# Patient Record
Sex: Female | Born: 1990 | Hispanic: Yes | Marital: Married | Smoking: Never smoker
Health system: Southern US, Community
[De-identification: ages and names within clinical notes are randomized; demographics above are authoritative.]

## PROBLEM LIST (undated history)

## (undated) ENCOUNTER — Inpatient Hospital Stay (HOSPITAL_COMMUNITY): Payer: Self-pay

## (undated) DIAGNOSIS — Z803 Family history of malignant neoplasm of breast: Secondary | ICD-10-CM

## (undated) DIAGNOSIS — Z789 Other specified health status: Secondary | ICD-10-CM

## (undated) DIAGNOSIS — Z8042 Family history of malignant neoplasm of prostate: Secondary | ICD-10-CM

## (undated) DIAGNOSIS — D649 Anemia, unspecified: Secondary | ICD-10-CM

## (undated) HISTORY — PX: NO PAST SURGERIES: SHX2092

## (undated) HISTORY — DX: Family history of malignant neoplasm of prostate: Z80.42

## (undated) HISTORY — DX: Family history of malignant neoplasm of breast: Z80.3

## (undated) HISTORY — PX: PLACEMENT OF BREAST IMPLANTS: SHX6334

---

## 2012-08-25 NOTE — L&D Delivery Note (Signed)
Delivery Note At 4:12 AM a viable female was delivered via Vaginal, Spontaneous Delivery (Presentation: Left Occiput Anterior).  APGAR: 8, 9; weight pending. Infant had spontaneous cry. Cord clamped x2 and cut and he was taken to warmer to be assessed by NICU team. Active management of 3rd stage with cord traction and pitocin. Placenta status: Intact, Spontaneous.  Cord: 3 vessels with the following complications: None. Cord pH: not obtained. No tears. Hemostasis achieved prior to leaving the room.  Anesthesia: Epidural  Episiotomy: None Lacerations: None Suture Repair: n/a Est. Blood Loss (mL): 350  Mom to postpartum.  Baby to NICU.  Sophia Decker, Sophia Decker 08/16/2013, 4:30 AM  I was present for and supervised the delivery of this newborn.  I agree with above documentation.  Loose body cord that was delivered through.   [name], [name], MD

## 2013-03-10 ENCOUNTER — Emergency Department (HOSPITAL_COMMUNITY)
Admission: EM | Admit: 2013-03-10 | Discharge: 2013-03-10 | Disposition: A | Discharge status: Home / Self Care | Payer: MEDICAID | Attending: Emergency Medicine | Admitting: Emergency Medicine

## 2013-03-10 ENCOUNTER — Encounter (HOSPITAL_COMMUNITY): Payer: Self-pay | Admitting: Adult Health

## 2013-03-10 DIAGNOSIS — R109 Unspecified abdominal pain: Secondary | ICD-10-CM | POA: Insufficient documentation

## 2013-03-10 DIAGNOSIS — O209 Hemorrhage in early pregnancy, unspecified: Secondary | ICD-10-CM | POA: Insufficient documentation

## 2013-03-10 DIAGNOSIS — N72 Inflammatory disease of cervix uteri: Secondary | ICD-10-CM | POA: Insufficient documentation

## 2013-03-10 DIAGNOSIS — O9989 Other specified diseases and conditions complicating pregnancy, childbirth and the puerperium: Secondary | ICD-10-CM | POA: Insufficient documentation

## 2013-03-10 DIAGNOSIS — O2390 Unspecified genitourinary tract infection in pregnancy, unspecified trimester: Secondary | ICD-10-CM | POA: Insufficient documentation

## 2013-03-10 DIAGNOSIS — N939 Abnormal uterine and vaginal bleeding, unspecified: Secondary | ICD-10-CM | POA: Diagnosis present

## 2013-03-10 LAB — CBC
Hemoglobin: 14.1 g/dL (ref 12.0–15.0)
MCH: 30.4 pg (ref 26.0–34.0)
Platelets: 214 10*3/uL (ref 150–400)
RBC: 4.64 MIL/uL (ref 3.87–5.11)
WBC: 7.2 10*3/uL (ref 4.0–10.5)

## 2013-03-10 LAB — POCT PREGNANCY, URINE: Preg Test, Ur: POSITIVE — AB

## 2013-03-10 LAB — WET PREP, GENITAL: Trich, Wet Prep: NONE SEEN

## 2013-03-10 LAB — HCG, QUANTITATIVE, PREGNANCY: hCG, Beta Chain, Quant, S: 100048 m[IU]/mL — ABNORMAL HIGH (ref <5)

## 2013-03-10 MED ORDER — METRONIDAZOLE 500 MG PO TABS: 500.0000 mg | ORAL_TABLET | Freq: Two times a day (BID) | ORAL | Status: AC

## 2013-03-10 MED ORDER — AZITHROMYCIN 250 MG PO TABS
1000.0000 mg | ORAL_TABLET | Freq: Once | ORAL | Status: AC
  Administered 2013-03-10: 1000 mg via ORAL
  Filled 2013-03-10: qty 2

## 2013-03-10 MED ORDER — AZITHROMYCIN 250 MG PO TABS
500.0000 mg | ORAL_TABLET | Freq: Once | ORAL | Status: DC
  Filled 2013-03-10: qty 2

## 2013-03-10 MED ORDER — CEFTRIAXONE SODIUM 250 MG IJ SOLR
250.0000 mg | Freq: Once | INTRAMUSCULAR | Status: AC
  Administered 2013-03-10: 250 mg via INTRAMUSCULAR
  Filled 2013-03-10: qty 250

## 2013-03-10 NOTE — ED Notes (Addendum)
Resents with vaginal bleed ing that began last night and lower abdominal cramping. Describes blood as dark and spotting, not heavy. LMP 01/12/13, pt states she is [redacted] weeks pregnant and has not had prenatal care yet.  Gravida 2, Para 0

## 2013-03-10 NOTE — ED Notes (Signed)
Patient stated that she noticed some vaginal bleeding last night and again today.  States she is approx [redacted] weeks pregnant, has not had any prenatal care at this time.

## 2013-03-10 NOTE — ED Provider Notes (Signed)
History    CSN: [id number] Arrival date & time 03/10/13  1448  None    Chief Complaint  Patient presents with  . Vaginal Bleeding   (Consider location/radiation/quality/duration/timing/severity/associated sxs/prior Treatment) HPI Comments: Sophia Decker 22 y.o. Presents for vaginal bleeding. [redacted]w[redacted]d pregnant per [facility] at recent visit. Vaginal bleeding is minimal and has not required use of pads but is worsening. No passage of tissue. Minimal abdominal cramping, like "period cramps". See below for further details.   Patient is a 22 y.o. female presenting with female genitourinary complaint. The history is provided by the patient.  Female GU Problem This is a new (vaginal bleeding) problem. The current episode started today (this morning). The problem occurs intermittently. The problem has been gradually worsening. Associated symptoms include abdominal pain (onset today, cramping in character, 3/10, no known exacerbating or relieving factors, minimal in severity). Pertinent negatives include no anorexia, arthralgias, change in bowel habit, chest pain, chills, congestion, coughing, diaphoresis, fatigue, fever, headaches, joint swelling, myalgias, nausea, neck pain, numbness, rash, sore throat, swollen glands, urinary symptoms, vertigo, visual change, vomiting or weakness. Nothing aggravates the symptoms. She has tried nothing for the symptoms.   History reviewed. No pertinent past medical history. History reviewed. No pertinent past surgical history. History reviewed. No pertinent family history. History  Substance Use Topics  . Smoking status: Never Smoker   . Smokeless tobacco: Not on file  . Alcohol Use: No   OB History   Grav Para Term Preterm Abortions TAB SAB Ect Mult Living   2 0 0 0 1          Review of Systems  Constitutional: Negative for fever, chills, diaphoresis and fatigue.  HENT: Negative for congestion, sore throat and neck pain.   Respiratory: Negative for  cough.   Cardiovascular: Negative for chest pain.  Gastrointestinal: Positive for abdominal pain (onset today, cramping in character, 3/10, no known exacerbating or relieving factors, minimal in severity). Negative for nausea, vomiting, anorexia and change in bowel habit.  Genitourinary: Positive for vaginal bleeding. Negative for dysuria, frequency, vaginal discharge, difficulty urinating, vaginal pain and dyspareunia.  Musculoskeletal: Negative for myalgias, joint swelling and arthralgias.  Skin: Negative for rash.  Neurological: Negative for vertigo, weakness, numbness and headaches.    Allergies  Review of patient's allergies indicates no known allergies.  Home Medications   Current Outpatient Rx  Name  Route  Sig  Dispense  Refill  . Prenatal Vit-Fe Fumarate-FA (PRENATAL MULTIVITAMIN) TABS   Oral   Take 1 tablet by mouth daily at 12 noon. Gummy pre-natal vitamin          BP 131/76  Pulse 78  Temp(Src) 98.7 F (37.1 C) (Oral)  Resp 16  Ht 6' (1.829 m)  Wt 160 lb (72.576 kg)  BMI 21.7 kg/m2  SpO2 100% Physical Exam  Constitutional: She is oriented to person, place, and time. She appears well-developed and well-nourished. No distress.  HENT:  Head: Normocephalic and atraumatic.  Right Ear: External ear normal.  Left Ear: External ear normal.  Eyes: Conjunctivae and EOM are normal. Right eye exhibits no discharge. Left eye exhibits no discharge.  Neck: Normal range of motion. Neck supple. No JVD present.  Cardiovascular: Normal rate, regular rhythm and normal heart sounds.  Exam reveals no gallop and no friction rub.   No murmur heard. Pulmonary/Chest: Effort normal and breath sounds normal. No stridor. No respiratory distress. She has no wheezes. She has no rales. She exhibits no tenderness.  Abdominal:  Soft. Bowel sounds are normal. She exhibits no distension. There is no tenderness. There is no rebound and no guarding.  Genitourinary: Cervix exhibits motion tenderness,  discharge (Muco-purulent) and friability (Strawberry appearance). Right adnexum displays no mass, no tenderness and no fullness. Left adnexum displays no mass, no tenderness and no fullness.  Musculoskeletal: Normal range of motion. She exhibits no edema.  Neurological: She is alert and oriented to person, place, and time.  Skin: Skin is warm. No rash noted. She is not diaphoretic.  Psychiatric: She has a normal mood and affect. Her behavior is normal.    ED Course  Procedures (including critical care time) Labs Reviewed  HCG, QUANTITATIVE, PREGNANCY - Abnormal; Notable for the following:    hCG, Beta Chain, Quant, S 100048 (*)    All other components within normal limits  POCT PREGNANCY, URINE - Abnormal; Notable for the following:    Preg Test, Ur POSITIVE (*)    All other components within normal limits  CBC   No results found. No diagnosis found. Results for orders placed during the hospital encounter of 03/10/13  HCG, QUANTITATIVE, PREGNANCY      Result Value Range   hCG, Beta Chain, Quant, S 100048 (*) <5 mIU/mL  CBC      Result Value Range   WBC 7.2  4.0 - 10.5 K/uL   RBC 4.64  3.87 - 5.11 MIL/uL   Hemoglobin 14.1  12.0 - 15.0 g/dL   HCT 16.1  09.6 - 04.5 %   MCV 86.9  78.0 - 100.0 fL   MCH 30.4  26.0 - 34.0 pg   MCHC 35.0  30.0 - 36.0 g/dL   RDW 40.9  81.1 - 91.4 %   Platelets 214  150 - 400 K/uL  POCT PREGNANCY, URINE      Result Value Range   Preg Test, Ur POSITIVE (*) NEGATIVE    Results for orders placed during the hospital encounter of 03/10/13  WET PREP, GENITAL      Result Value Range   Yeast Wet Prep HPF POC RARE YEAST (*) NONE SEEN   Trich, Wet Prep NONE SEEN  NONE SEEN   Clue Cells Wet Prep HPF POC NONE SEEN  NONE SEEN   WBC, Wet Prep HPF POC MANY (*) NONE SEEN  HCG, QUANTITATIVE, PREGNANCY      Result Value Range   hCG, Beta Chain, Quant, S 100048 (*) <5 mIU/mL  CBC      Result Value Range   WBC 7.2  4.0 - 10.5 K/uL   RBC 4.64  3.87 - 5.11 MIL/uL    Hemoglobin 14.1  12.0 - 15.0 g/dL   HCT 78.2  95.6 - 21.3 %   MCV 86.9  78.0 - 100.0 fL   MCH 30.4  26.0 - 34.0 pg   MCHC 35.0  30.0 - 36.0 g/dL   RDW 08.6  57.8 - 46.9 %   Platelets 214  150 - 400 K/uL  POCT PREGNANCY, URINE      Result Value Range   Preg Test, Ur POSITIVE (*) NEGATIVE     BED SIDE [place] - limited transabdominal  Indication: pregnancy with vaginal bleeding Findings: intrauterine fetal pole present with fetal HR of >130 MDM  Sophia Decker 22 y.o. presents with vaginal bleeding. No symptoms of acute blood loss anemia. AFVSS. HDS. Hcg > 100k. Hgb stable. Bed side US shows IUP with FHR. GU exam - mucosa. A discharge from the cervix, cervix is friable and Strawberry  in appearance. Cervix did appear closed.  Wet prep negative for trichomonas and BV. However I have a high clinical suspicion trichomonas is present. In addition, with mucopurulent discharge, cannot rule out other STDs. Patient was given 250 mg of intramuscular Rocephin and 1 g of azithromycin in the emergency department. She was given a prescription for Flagyl and instructed to take this for the next 7 days. She also instructed to establish care with an OB/GYN clinic and obtain followup. She was educated on the diagnosis of threatened abortion and possible STIs. She was given strong return precautions.   Labs reviewed. I discussed this patient with my attending, Dr. Freida Busman.   Sena Hitch, MD 03/11/13 [phone]

## 2013-03-10 NOTE — ED Notes (Signed)
1910  Received report from off going RN and introduced myself to the pt.  Status of pt is good at this time but will continue to monitor

## 2013-03-11 LAB — GC/CHLAMYDIA PROBE AMP
CT Probe RNA: NEGATIVE
GC Probe RNA: NEGATIVE

## 2013-03-13 NOTE — ED Provider Notes (Signed)
I saw and evaluated the patient, reviewed the resident's note and I agree with the findings and plan.  Phil Michels T Otho Michalik, MD 03/13/13 0705 

## 2013-03-24 ENCOUNTER — Encounter: Payer: Self-pay | Admitting: Obstetrics & Gynecology

## 2013-03-24 ENCOUNTER — Ambulatory Visit (INDEPENDENT_AMBULATORY_CARE_PROVIDER_SITE_OTHER): Payer: Self-pay | Admitting: Obstetrics & Gynecology

## 2013-03-24 VITALS — BP 121/67 | Temp 98.0°F | Ht 68.0 in | Wt 160.5 lb

## 2013-03-24 DIAGNOSIS — Z3490 Encounter for supervision of normal pregnancy, unspecified, unspecified trimester: Secondary | ICD-10-CM | POA: Insufficient documentation

## 2013-03-24 DIAGNOSIS — O209 Hemorrhage in early pregnancy, unspecified: Secondary | ICD-10-CM

## 2013-03-24 DIAGNOSIS — Z3480 Encounter for supervision of other normal pregnancy, unspecified trimester: Secondary | ICD-10-CM

## 2013-03-24 DIAGNOSIS — Z3492 Encounter for supervision of normal pregnancy, unspecified, second trimester: Secondary | ICD-10-CM

## 2013-03-24 DIAGNOSIS — Z3491 Encounter for supervision of normal pregnancy, unspecified, first trimester: Secondary | ICD-10-CM

## 2013-03-24 LAB — POCT URINALYSIS DIP (DEVICE)
Hgb urine dipstick: NEGATIVE
Nitrite: NEGATIVE
Specific Gravity, Urine: 1.02 (ref 1.005–1.030)
Urobilinogen, UA: 0.2 mg/dL (ref 0.0–1.0)
pH: 7 (ref 5.0–8.0)

## 2013-03-24 NOTE — Progress Notes (Signed)
Ultrasound scheduled for 03/30/13 at 10:15 am.

## 2013-03-24 NOTE — Patient Instructions (Addendum)
Cuidados prenatales (Prenatal Care) QU SON LOS CUIDADOS PRENATALES?  Cuidados prenatales significan la asistencia de la salud antes de que nazca el beb. Cudese usted y tambin cuide al beb del siguiente modo:   Siga los cuidados prenatales desde comienzos del Psychiatrist. Si sabe que est embarazada o piensa que podra estarlo, comunquese con su mdico lo antes posible. Arregle una visita para un examen general o prenatal.  Siga los cuidados prenatales de [place] regular. Siga el esquema para realizar anlisis de Enterprise y otras pruebas necesarias y Joelyn Oms a todas las citas.  Si hace todo esto podr [employer]co Wholesale y la del beb durante todo el Psychiatrist.  Los cuidados prenatales deben incluir una evaluacin de las necesidades mdicas, [place], [place], psicolgicas y [place] para la pareja y la historia [place], [place] y gentica de la familia de la madre y el padre.  Comente con su mdico:  Los medicamentos prescriptos, de venta libre y las hierbas medicinales que toma.  Si consume drogas, alcohol o fuma.  Si sufre violencia o abuso familiar.  Las vacunas que ha recibido.  Su nutricin y Surveyor, mining.  La actividad fsica Development worker, international aid.  Peligros ambientales y Forensic psychologist, del hogar y [place].  Historia de enfermedades de transmisin sexual que hayan sufrido usted y Risk analyst.  Embarazos previos. PORQU LOS CUIDADOS PRENATALES SON TAN IMPORTANTES?  Si la controla con regularidad, el profesional tendr la oportunidad de Clinical research associate problemas precozmente, de modo que puede tratarlos lo antes posible. Tambin puede llegar a Photographer. Muchos estudios han demostrado que una atencin prenatal temprana y regular es importante tanto para la salud de las madres como de sus bebs.  Concha Se EMBARAZADA COMO PUEDO CUIDARME?  El cuidado de la mujer antes de quedar embarazada la ayuda a Warehouse manager un Psychiatrist saludable y a Technical sales engineer las posibilidades de tener un beb con  problemas. Estas son las formas de cuidarse antes de quedan embarazada:   Consuma alimentos saludables, realice actividad fsica regularmente (lo ms aconsejable es 30 minutos por da, [employer]) y descanse y duerma lo suficiente. Converse con el profesional acerca de cules son los alimentos y ejercicios que ms le convienen.  Tome 400 microgramos (mcg) de cido flico (una de las vitaminas del grupo B) todos [place]. Lo ms aconsejable es tomar un multivitamnico que contenga esta cantidad de cido flico. Si toma la cantidad suficiente de cido flico sinttico (manufacturado) todos los [place], antes de [place] y a comienzos del Psychiatrist, podr evitar que el beb sufra ciertos defectos. Hay cereales para el desayuno y otros productos que contienen granos a los que se les ha agregado cido flico, pero no todos contienen 400 mcg por porcin. Controle las etiquetas del multivitamnico o del cereal para conocer la cantidad de cido flico que contienen.  Verifique que cuenta con todas las vacunaciones, especialmente la de la [place]. La rubola puede causar graves defectos al beb. La varicela es otra enfermedad que debe evitarse durante el [place]. Si ha sufrido varicela o rubola en el pasado, usted est inmunizada.  Informe al mdico acerca de todos los medicamentos prescriptos, de venta libre o hierbas que toma. Algunos medicamentos no son seguros para tomarlos Academic librarian.  Deje de fumar, de beber alcohol o de tomar drogas. Pdale ayuda al mdico, si es necesario.  Comente y trate cualquier problema mdico, social o psicolgico antes de quedar [place].  Comente cualquier historia de problemas genticos en su familia o en la del padre. Siempre  que sea posible, haga pruebas genticas antes de quedar embarazada.  Comente a su mdico si es vctima de maltratos fsicos o emocionales.  Comente con el profesional si est expuesta a sustancias qumicas perjudiciales en su  lugar de trabajo o en el lugar en el que vive.  Comente con el profesional si considera que su trabajo o las horas que trabaja pueden ser perjudiciales y debera modificarlos.  El padre debe estar incluido en todas las tomas de decisiones con todos los aspectos del [place], el [place] de parto y [place].  Si tiene KB Home	[place], asegrese que esta cubierta por Psychiatrist. RECIN ME ENTERO QUE ESTOY EMBARAZADA COMO PUEDO CUIDARME?  Aqu hay algunas indicaciones para cuidarse usted misma y la preciosa nueva vida que crece en su interior.   Contine tomando el multivitamnico con 400 microgramos (mcg) de cido [employer]ab.  Siga los cuidados prenatales desde comienzos del embarazo. No importa si este es Contractor o si ya tiene otros nios. Es importante que consulte con un profesional durante el Psychiatrist. El Facilities manager en cada visita para verificar que usted y el beb estn sanos. Si hubiera problemas, tomar medidas para ayudarla a usted o a su beb.  Consuma una dieta saludable que incluya.  Frutas  Vegetales  Alimentos que contengan pocas grasas saturadas.  Granos.  Alimentos ricos en calcio.  Beba entre 6 y 8 vasos de lquidos por Futures trader.  Excepto que el profesional le indique lo contrario, trate de mantenerse fsicamente activa durante 30 minutos, casi todos los [address] de la [place]. Si no tiene tiempo, realice [place] en segmentos de 10 minutos, tres Teacher, early years/pre.  Si fuma, bebe alcohol o consume drogas DEJE DE HACERLO. Estas sustancias pueden causar daos crnicos al beb. Converse con el profesional que la asiste con respecto a los pasos a seguir para International aid/development worker. Converse con algn miembro de la comunidad religiosa, terapeuta, un amigo de confianza o su mdico, si est preocupada con respecto a su consumo de alcohol o drogas.  Consulte con el profesional antes de tomar Pacific Mutual, an los de [employer]. Algunos medicamentos no son  seguros para tomarlos Academic librarian.  Descanse y duerma lo suficiente.  Evite jacuzzis y saunas durante el embarazo  No pueden tomarle radiografas, excepto que sea absolutamente necesario y con autorizacin del mdico. Le colocarn una pantalla de plomo sobre el abdomen para proteger al beb cuando los rayos x ingresen a su organismo  No limpie el lecho del gato si est embarazada. Puede contener un parsito que causa una infeccin denominada toxoplasmosis, que ocasiona defectos en el beb. Tambin utilice guantes cuando trabaje en las zonas del jardn en las que se encuentra el [place].  No coma carne, pescado o queso crudo o mal cocido.  Permanezca alejada de sustancias txicas como:  Insecticidas.  Solventes (como algunos limpiadores o disolvente de pinturas).  Plomo.  Mercurio.  Puede tener relaciones sexuales hasta el final del embarazo excepto que sufra algn problema mdico o tenga alguna dificultad en el embarazo y su mdico le aconseje que no las tenga.  No use tacones altos, especialmente durante la segunda mitad del [place]. Puede perder el equilibrio y caerse.  No haga viajes largos excepto que sea absolutamente necesario. Asegrese de [employer] visita al mdico antes de hacer el viaje.  No permanezca sentada en una posicin durante ms de 2 horas cuando viaje.  Lleve una copia de los registros mdicos cuando  vaya de viaje.  Averige dnde [facility] hospital en la ciudad que visitar, en caso de emergencia.  Los productos de limpieza ms peligrosos tienen advertencias para la mujer embarazada en la etiqueta. Consulte con el profesional con respecto a los productos si no est segura.  Limite o elimine su consumo de cafena proveniente del caf, t, gaseosas, medicamentos y chocolate.  Muchas mujeres siguen trabajando Academic librarian. Permanecer activa la ayuda a mantenerse sana. Si tiene preguntas relacionadas con la seguridad o las horas que trabaja,  consltelo con el profesional que la asiste.  Mantngase informada:  Lea libros.  Mire vdeos.  Concurra con el padre a las clases de preparto.  Converse con otras mams experimentadas.  Consulte con el profesional con respecto a las clases de preparto para usted y Camera operator. Las clases la ayudarn a usted y su compaero a prepararse para el nacimiento de su beb.  Busque un pediatra (mdico de bebs) y Civil engineer, contracting de los mtodos para Acupuncturist dolor durante el [place] de [place], [place] parto y la probabilidad de Neomia Dear cesrea. NO PIENSO QUEDAR EMBARAZADA TODAVA, PERO HE ESCUCHADO QUE TODAS LAS MUJERES DEBEN TOMAR CIDO FLICO TODOS LOS DAS.  Todas las Tyson Foods en edad frtil, an aquellas que tengan una posibilidad [place] de quedar embarazadas, deben tratar de tomar una cantidad suficiente de cido flico Muchos embarazos no son planeados. Muchas mujeres no saben que estn embarazadas y ciertos defectos congnitos se producen a comienzos del Psychiatrist. Tomar 400 microgramos (mcg) de cido flico todos los D[employer] Restaurants ayudara a Automotive engineer ciertos defectos congnitos que se producen a comienzos del Psychiatrist. Si una mujer comienza a tomar vitaminas en el segundo o tercer mes del [place], podra ser muy tarde para evitar algunos defectos congnitos. El cido flico puede tener otros efectos beneficiosos en la salud de la [place], [place] de prevenir defectos cngnitos.  CON QU FRECUENCIA DEBO VISITAR AL MDICO DURANTE EL EMBARAZO?  El Nurse, mental health sus visitas prenatales. A medida que est ms cerca del final del embarazo, las visitas sern ms frecuentes. Un embarazo promedio dura alrededor de 40 semanas.  Una programacin de visitas tpica consiste en:   Aproximadamente una por mes durante los primeros seis meses de [place].  Cada dos Auto[employer]Owners Insurance meses siguientes.  Una vez por semana en el ltimo mes, hasta la fecha de [place]. El profesional querr verla ms a menudo.  Si tiene  mas de 35 aos de edad.  Su embarazo es de alto riesgo debido a que usted tiene Therapist, sports de salud o problemas con el embarazo como:  Diabetes.  Presin arterial elevada.  El beb no se desarrolla segn las pautas, de acuerdo con la fecha del Psychiatrist. En estos casos la sometern a pruebas especiales para verificar que ni usted ni el beb tengan problemas graves. QU SUCEDE DURANTE LAS VISITAS PRENATALES?   En su primera visita, el profesional conversar con usted y su compaero acerca de la historia clnica de ambos y de sus respectivas familias, y Civil engineer, contracting un examen fsico.  En su primera visita, el examen mdico incluir controles de la presin arterial, el peso y la altura y un examen de los rganos plvicos. El mdico har un [place] si no tiene uno reciente, y har cultivos del cuello del tero para descartar infecciones.  En cada visita deber realizar anlisis de sangre, orina, tomarse la presin arterial, controlar el peso y verificar el progreso del beb.  El profesional podr decirle cuando  es la fecha probable en la que el beb nacer.  En cada visita tambin tendr la posibilidad de aprender a mantenerse saludable durante el embarazo y podr hacer preguntas.  Comente con su mdico si est decidida a amamantar.  El Hospital doctor como se desarrolla el beb. Le realizarn varios tipos de anlisis a medida que el Financial planner.  Le tomarn ecografas para controlar el desarrollo y la salud del beb.  Podrn solicitarle otros anlisis de sangre si es necesario. Podr incluir una amniocentesis (examen del lquido amnitico), pruebas de estrs (controla como responde el beb a las contracciones), el perfil biofsico (mediciones del feto). El Airline pilot acerca de los [place] y porqu son necesarios. ESTOY POR CUMPLIR 40 AOS Y QUIERO TENER UN HIJO DEBO TOMAR PRECAUCIONES ESPECIALES?  A medida que una mujer envejece, tiene ms  probabilidades de tener problemas mdicos (hipertensin arterial), problemas del embarazo (preeclampsia, problemas con la placenta), aborto espontneo o que el nio nazca con un defecto congnito. Sin embargo, la Harley[employer]son de las mujeres a final de la dcada de los 30 aos o a comienzo de los 40, tienen bebs sanos. Consulte con el profesional de [place] regular antes de [place] y Clinical biochemist todos los exmenes que se le solicitarn durante el [place]. El profesional probablemente querr que se someta a algunas pruebas especiales para usted y su beb.  Actualmente las mujeres postergan la decisin de tener hijos hasta cuando tienen treinta o [place]. Aunque muchas mujeres de esta edad no tienen dificultad para quedar embarazadas, a medida que se envejece, la fertilidad declina. Las mujeres de ms de 40 aos que no pueden quedar embarazadas luego de intentarlo durante 6 meses, debern someterse a una evaluacin de su fertilidad. No es poco frecuente tener problemas para quedar embarazada o sufrir infertilidad (imposibilidad para quedar embarazada luego de tratar durante un ao). Si cree que usted o su compaero podran ser infrtiles, comntelo con el profesional que la asiste, quin puede recomendarle tratamientos con medicamentos, [place] o tecnologa para la reproduccin asistida.  Document Released: 01/28/2008 Document Revised: 11/03/2011 [facility] Patient Information 2014 [place], [place]. Lactancia materna  (Breastfeeding)  El cambio hormonal durante el Psychiatrist produce el desarrollo del tejido [place] y un aumento en el nmero y tamao de los conductos galactforos. La hormona prolactina permite que las protenas, los azcares y las grasas de la sangre produzcan la [employer] materna en las glndulas productoras de [place]. La hormona progesterona impide que la leche materna sea liberada antes del nacimiento del beb. Despus del nacimiento del beb, su nivel de progesterona disminuye permitiendo que la  leche materna sea [place]. Pensar en el beb, as como la succin o Theatre manager, pueden estimular la liberacin de [place] de las glndulas productoras de [place].  La decisin de Company secretary) es una de las mejores opciones que usted puede hacer para usted y su beb. La informacin que sigue da una breve resea de los beneficios, as Lexicographer que debe saber sobre la [place].  LOS BENEFICIOS DE AMAMANTAR  Para el beb   La primera leche (calostro) ayuda al mejor funcionamiento del sistema digestivo del beb.   La leche tiene anticuerpos que provienen de la madre y que ayudan a prevenir las infecciones en el beb.   El beb tiene una menor incidencia de asma, alergias y del sndrome de muerte sbita del lactante (SMSL).   Los nutrientes de la [place] materna son mejores para el beb que la [place].  La leche materna mejora el desarrollo cerebral del beb.   Su beb tendr menos gases, clicos y estreimiento.  Es menos probable que el beb desarrolle otras enfermedades, como obesidad infantil, asma o diabetes mellitus. Para usted   La lactancia materna favorece el desarrollo de un vnculo muy especial entre la madre y el beb.   Es ms conveniente, siempre disponible, a la [place] y [place].   La lactancia materna ayuda a quemar caloras y a perder el peso ganado durante el [place].   Hace que el tero se contraiga ms rpidamente a su tamao normal y Consolidated Edison sangrado despus del [place].   Las [employer] que amamantan tienen menos riesgo de Environmental education officer osteoporosis o cncer de mama o de ovario en el futuro.  FRECUENCIA DEL AMAMANTAMIENTO   Un beb sano, nacido a trmino, puede amamantarse con tanta frecuencia como cada hora, o espaciar las comidas cada tres horas. La frecuencia en la lactancia varan de un beb a otro.   Los recin nacidos deben ser alimentados por lo menos cada 2-3 horas Administrator y cada  4-5 horas durante la noche. Usted debe amamantarlo un mnimo de 8 tomas en un perodo de 24 horas.  Despierte al beb para amamantarlo si han pasado 3-4 horas desde la ltima comida.  Amamante cuando sienta la necesidad de reducir la plenitud de sus senos o cuando el beb muestre signos de [place]. Las seales de que el beb puede Gentry Fitz son:  Lenora Boys su estado de alerta o vigilancia.  Se estira.  Mueve la cabeza de un lado a otro.  Mueve la cabeza y abre la boca cuando se le toca la mejilla o la boca (reflejo de succin).  Aumenta las vocalizaciones, tales como sonidos de succin, relamerse los labios, arrullos, suspiros, o chirridos.  Mueve la [employer] boca.  Se chupa con ganas los dedos o las manos.  Agitacin.  Llanto intermitente.  Los signos de hambre extrema requerirn que lo calme y lo consuele antes de tratar de alimentarlo. Los signos de hambre extrema son:  Agitacin.  Llanto fuerte e intenso.  Gritos.  El amamantamiento frecuente la ayudar a producir ms [place] y a Education officer, community de Engineer, mining en los pezones e hinchazn de las [place].  LACTANCIA MATERNA   Ya sea que se encuentre acostada o sentada, asegrese que el abdomen del beb est enfrente el suyo.   Sostenga la mama con el pulgar por arriba y los otros 4 dedos por debajo del pezn. Asegrese que sus dedos se encuentren lejos del pezn y de la boca del beb.   Empuje suavemente los labios del beb con el pezn o con el dedo.   Cuando la boca del beb se abra lo suficiente, introduzca el pezn y la zona oscura que lo rodea (areola) tanto como le sea posible dentro de la boca.  Debe haber ms areola visible por arriba del labio superior que por debajo del labio inferior.  La lengua del beb debe estar entre la enca inferior y el seno.  Asegrese de que la boca del beb est en la posicin correcta alrededor del pezn (prendida). Los labios del beb deben crear un sello sobre su pecho.  Las  seales de que el beb se ha prendido eficazmente al pezn son:  Payton Doughty o succiona sin dolor.  Se escucha que traga Lyondell [employer].  No hace ruidos ni chasquidos.  Hay movimientos musculares por arriba y por delante de sus odos al Printmaker.  El beb debe succionar unos 2-3 minutos para que salga la [place]. Permita que el nio se alimente en cada mama todo lo que desee. Alimente al beb hasta que se desprenda o se quede dormido en Freight forwarder y luego ofrzcale el segundo pecho.  Las seales de que el beb est lleno y satisfecho son:  Disminuye gradualmente el nmero de succiones o no succiona.  Se queda dormido.  Extiende o relaja su cuerpo.  Retiene una pequea cantidad de [facility] boca.  Se desprende del pecho por s mismo.  Los signos de una lactancia materna eficaz son:  Los senos han aumentado la firmeza, el peso y el tamao antes de la alimentacin.  Son ms blandos despus de amamantar.  Un aumento del volumen de [employer], y tambin el cambio de su consistencia y color se producen hacia el quinto da de Tour manager.  La congestin mamaria se [place] al dar de [place]es.  Los pezones no duelen, ni estn agrietados ni sangran.  De ser necesario, interrumpa la succin poniendo su dedo en la esquina de la boca del beb y deslizando el dedo entre sus encas. A continuacin, retire la mama de su boca.  Es comn que los bebs regurgiten un poco despus de comer.  A menudo los bebs tragan aire al alimentarse. Esto puede hacer que se sienta molesto. Hacer eructar al beb al Pilar Plate de pecho puede ser de [place].  Se recomiendan suplementos de vitamina D para los bebs que reciben slo [address].  Evite el uso del chupete durante las primeras 4 a 6 semanas de vida.  Evite la alimentacin suplementaria con agua, frmula o jugo en lugar de la [employer] Pal[employer]. La leche materna es todo el alimento que el beb necesita. No es necesario que el nio ingiera agua o  preparados de bibern. Sus pechos producirn ms leche si se evita la alimentacin suplementaria durante las primeras semanas. COMO SABER SI EL BEB OBTIENE LA SUFICIENTE LECHE MATERNA  Preguntarse si el beb obtiene la cantidad suficiente de [place] es una preocupacin frecuente [employer]. Puede asegurarse que el beb tiene la leche suficiente si:   El beb succiona activamente y usted escucha que traga.   El beb parece estar relajado y satisfecho despus de Psychologist, clinical.   El nio se alimenta al menos 8 a 12 veces en 24 horas.  Durante los primeros 3 a 5 das de vida:  Moja 3-5 paales en 24 horas. La materia fecal debe ser blanda y [place].  Tiene al menos 3 a 4 deposiciones en 24 horas. La materia fecal debe ser blanda y [place].  A los 5-7 das de vida, el beb debe tener al menos 3-6 deposiciones en 24 horas. La materia fecal debe ser grumosa y [place] a los 5 [address] de [place].  Su beb tiene una prdida de Psychologist, counselling a 7al 10% durante los primeros 3 [address]  [address] de [address]  El beb no pierde peso despus de 3-7 [address] de [address]  El beb debe aumentar 4 a 6 libras (120 a 170 gr.) por semana despus de los 4 [address] de vida.  Aumenta de Emet a los [address] de vida y vuelve al peso del nacimiento dentro de las 2 semanas. CONGESTIN MAMARIA  Durante la primera semana despus del [place], usted puede experimentar hinchazn en las mamas (congestin [place]). Al estar congestionadas, las mamas se sienten pesadas, calientes o sensibles al tacto. El pico de la congestin ocurre a las 24 -48 horas  despus del parto.   La congestin puede disminuirse:  Continuando con la Tour manager.  Aumentando la frecuencia.  Tomando duchas calientes o aplicando calor hmedo en los senos antes de cada comida. Esto aumenta la circulacin y [place] a que la [place].   Masajeando suavemente el pecho antes y [employer]. Con las yemas de los dedos, masajee la pared del pecho hacia el pezn en un  movimiento circular.   Asegurarse de que el beb vaca al menos uno de sus pechos en cada alimentacin. Tambin ayuda si comienza la siguiente toma en el otro seno.   Extraiga manualmente o con un sacaleches las mamas para vaciar los pechos si el beb tiene sueo o no se aliment bien. Tambin puede extraer la [employer] cuando vuelva a trabajar o si siente que se estn congestionando las Latexo.  Asegrese de que el beb se prende y est bien colocado durante la Market researcher. Si sigue estas indicaciones, la congestin debe mejorar en 24 a 48 horas. Si an tiene dificultades, consulte a Barista.  CUDESE USTED MISMA  Cuide sus mamas.   Bese o dchese diariamente.   Evite usar Eaton Corporation.   Use un sostn de soporte Evite el uso de sostenes con aro.  Seque al aire sus pezones durante 3-4 minutos despus de cada comida.   Utilice slo apsitos de algodn en el sostn para absorber las prdidas de [place]. La prdida de un poco de Deere & Company las comidas es normal.   Use solamente lanolina pura en sus pezones despus de Museum/gallery exhibitions officer. Usted no tiene que lavarla antes de alimentar al beb. Otra opcin es sacarse unas gotas de [place] y [employer]co Holdings pezones.  Continuar con los autocontroles de la mama. Cudese.   Consuma alimentos saludables. Alterne 3 comidas con 3 colaciones.  Evite los alimentos que usted nota que perjudican al beb.  Dixie Dials, jugos de fruta y agua para Patent examiner su sed (aproximadamente 8 vasos al Futures trader).   Descanse con frecuencia, reljese y tome sus vitaminas prenatales para evitar la fatiga, el estrs y la anemia.  Evite masticar y fumar tabaco.  Evite el consumo de alcohol y drogas.  Tome medicamentos de venta libre y recetados tal como le indic su mdico o Social research officer, government. Siempre debe consultar con su mdico o farmacutico antes de tomar cualquier medicamento, vitamina o suplemento de hierbas.  Sepa que durante la  lactancia puede quedar embarazada. Si lo desea, hable con su mdico acerca de la planificacin familiar y los mtodos anticonceptivos seguros que puede utilizar durante la Market researcher. SOLICITE ATENCIN MDICA SI:   Usted siente que quiere dejar de Museum/gallery exhibitions officer o se siente frustrada con la lactancia.  Siente dolor en los senos o en los pezones.  Sus pezones estn agrietados o Water quality scientist.  Sus pechos estn irritados, sensibles o calientes.  Tiene un rea hinchada en cualquiera de los senos.  Siente escalofros o fiebre.  Tiene nuseas o vmitos.  Observa un drenaje en los pezones.  Sus mamas no se llenan antes de Marine scientist al 5to da despus del [place].  Se siente triste y deprimida.  El nio est demasiado somnoliento como para comer.  El nio tiene problemas para Industrial/product designer.   Moja menos de 3 paales en 24 horas.  Mueve el intestino menos de 3 veces en 24 horas.  La piel del beb o la parte blanca de sus ojos est ms amarilla.   El beb no ha aumentado de [place] a  los 5 [address] ASEGRESE DE QUE:   Comprende estas instrucciones.  Controlar su enfermedad.  Solicitar ayuda de inmediato si no mejora o si empeora. Document Released: 08/11/2005 Document Revised: 05/05/2012 [facility] Patient Information 2014 [place], [place].

## 2013-03-24 NOTE — Progress Notes (Signed)
P=76,  here for initial ob . Used Equities trader.Referred from ER. States they told her she had cervicitis  And gave her medicine which she completed. Given new patient information . Discussed bmi and weight gain.States sure lmp was May, but not exactly sure of date- usually last 6 days and finished on May 21/2014. States went to ER for c/o vaginal bleeding, none since then.

## 2013-03-25 LAB — OBSTETRIC PANEL
Antibody Screen: NEGATIVE
Basophils Absolute: 0 10*3/uL (ref 0.0–0.1)
Basophils Relative: 0 % (ref 0–1)
Eosinophils Relative: 0 % (ref 0–5)
HCT: 39.2 % (ref 36.0–46.0)
Hemoglobin: 13.6 g/dL (ref 12.0–15.0)
Lymphocytes Relative: 20 % (ref 12–46)
MCHC: 34.7 g/dL (ref 30.0–36.0)
MCV: 87.1 fL (ref 78.0–100.0)
Monocytes Absolute: 0.6 10*3/uL (ref 0.1–1.0)
Monocytes Relative: 6 % (ref 3–12)
Neutro Abs: 6.5 10*3/uL (ref 1.7–7.7)
RDW: 13.4 % (ref 11.5–15.5)
Rh Type: POSITIVE
Rubella: 1.95 Index — ABNORMAL HIGH (ref <0.90)

## 2013-03-25 LAB — WET PREP, GENITAL

## 2013-03-26 LAB — CULTURE, OB URINE: Colony Count: 40000

## 2013-03-28 LAB — HEMOGLOBINOPATHY EVALUATION
Hemoglobin Other: 0 %
Hgb A2 Quant: 2.6 % (ref 2.2–3.2)
Hgb F Quant: 0 % (ref 0.0–2.0)

## 2013-03-30 ENCOUNTER — Ambulatory Visit (HOSPITAL_COMMUNITY)
Admission: RE | Admit: 2013-03-30 | Discharge: 2013-03-30 | Disposition: A | Discharge status: Home / Self Care | Payer: Self-pay | Source: Ambulatory Visit | Attending: Obstetrics & Gynecology | Admitting: Obstetrics & Gynecology

## 2013-03-30 DIAGNOSIS — Z3689 Encounter for other specified antenatal screening: Secondary | ICD-10-CM | POA: Insufficient documentation

## 2013-03-30 DIAGNOSIS — O3680X0 Pregnancy with inconclusive fetal viability, not applicable or unspecified: Secondary | ICD-10-CM | POA: Insufficient documentation

## 2013-03-30 DIAGNOSIS — O209 Hemorrhage in early pregnancy, unspecified: Secondary | ICD-10-CM | POA: Insufficient documentation

## 2013-04-07 ENCOUNTER — Inpatient Hospital Stay (HOSPITAL_COMMUNITY)
Admission: AD | Admit: 2013-04-07 | Discharge: 2013-04-07 | Disposition: A | Discharge status: Home / Self Care | Payer: Self-pay | Source: Ambulatory Visit | Attending: Family Medicine | Admitting: Family Medicine

## 2013-04-07 ENCOUNTER — Encounter (HOSPITAL_COMMUNITY): Payer: Self-pay

## 2013-04-07 DIAGNOSIS — O209 Hemorrhage in early pregnancy, unspecified: Secondary | ICD-10-CM | POA: Insufficient documentation

## 2013-04-07 HISTORY — DX: Other specified health status: Z78.9

## 2013-04-07 NOTE — MAU Note (Signed)
  Patient states she had bright red bleeding earlier this am that ran down her legs, now is more watery pink and less. Denies pain.

## 2013-04-07 NOTE — MAU Provider Note (Signed)
  History     CSN: [id number]  Arrival date and time: 04/07/13 1157   None     Chief Complaint  Patient presents with  . Vaginal Bleeding   HPI Pt present today with bright red bleed that started this morning after pt masturbated. Pt states she used only her hands and no toys or objects and no vaginal insertion. Pt denies UTI sx or cramping.  states she had light pink spotting about one hour ago, intercourse this am. Denies abdominal pain and no bleeding at this time. Has headaches off and on   RN note:  Registered Nurse Signed MAU Note Service date: 04/07/2013 12:06 PM   Patient states she had bright red bleeding earlier this am that ran down her legs, now is more watery pink and less. Denies pain.      Past Medical History  Diagnosis Date  . Medical history non-contributory     History reviewed. No pertinent past surgical history.  Family History  Problem Relation Age of Onset  . Cancer Father   . Migraines Mother     History  Substance Use Topics  . Smoking status: Never Smoker   . Smokeless tobacco: Never Used  . Alcohol Use: No    Allergies: No Known Allergies  Prescriptions prior to admission  Medication Sig Dispense Refill  . Prenatal Vit-Fe Fumarate-FA (PRENATAL MULTIVITAMIN) TABS Take 1 tablet by mouth daily at 12 noon. Gummy pre-natal vitamin        Review of Systems  Constitutional: Negative for fever and chills.  Gastrointestinal: Negative for heartburn, nausea, vomiting, abdominal pain, diarrhea and constipation.  Genitourinary: Negative for dysuria and urgency.   Physical Exam   Blood pressure 144/79, pulse 82, temperature 98.4 F (36.9 C), temperature source Oral, resp. rate 16, height 5\' 8"  (1.727 m), weight 73.211 kg (161 lb 6.4 oz), last menstrual period 01/06/2013, SpO2 100.00%.  Physical Exam  Nursing note and vitals reviewed. Constitutional: She is oriented to person, place, and time. She appears well-developed and well-nourished. No  distress.  HENT:  Head: Normocephalic.  Eyes: Pupils are equal, round, and reactive to light.  Neck: Normal range of motion. Neck supple.  Cardiovascular: Normal rate.   Respiratory: Effort normal.  GI: Soft. She exhibits no distension. There is no tenderness. There is no rebound and no guarding.  FHT obtained with doppler  Genitourinary:  No external lacerations, excoriations or fissures noted; small amount of blood tinged discharge in vault- small amount of blood tinged mucous at os; cervix closed, NT-   Musculoskeletal: Normal range of motion.  Neurological: She is alert and oriented to person, place, and time.  Skin: Skin is warm and dry.  Psychiatric: She has a normal mood and affect.    MAU Course  Procedures Pt reassured since has documented viable IUP with no evidence of SCH on 04/01/2013 [place] and has HFR and closed and long cervix   Assessment and Plan  Bleeding in pregnancy F/u with OB scheduled appointment- return sooner if increase in bleeding  Louise Victory 04/07/2013, 12:27 PM

## 2013-04-07 NOTE — MAU Provider Note (Signed)
Chart reviewed and agree with management and plan.  

## 2013-04-21 ENCOUNTER — Ambulatory Visit (INDEPENDENT_AMBULATORY_CARE_PROVIDER_SITE_OTHER): Payer: Self-pay | Admitting: Advanced Practice Midwife

## 2013-04-21 ENCOUNTER — Encounter: Payer: Self-pay | Admitting: Advanced Practice Midwife

## 2013-04-21 VITALS — BP 110/65 | Temp 98.8°F | Wt 159.1 lb

## 2013-04-21 DIAGNOSIS — O209 Hemorrhage in early pregnancy, unspecified: Secondary | ICD-10-CM

## 2013-04-21 LAB — POCT URINALYSIS DIP (DEVICE)
Nitrite: NEGATIVE
Protein, ur: NEGATIVE mg/dL
Specific Gravity, Urine: 1.025 (ref 1.005–1.030)
Urobilinogen, UA: 0.2 mg/dL (ref 0.0–1.0)
pH: 7 (ref 5.0–8.0)

## 2013-04-21 NOTE — Patient Instructions (Addendum)
Embarazo - Segundo trimestre (Pregnancy - Second Trimester) El segundo trimestre del embarazo (del 3 al 6 mes) es un perodo de evolucin rpida para usted y el beb. Hacia el final del sexto mes, el beb mide aproximadamente 23 cm y pesa 680 g. Comenzar a sentir los movimientos del beb entre las 18 y las 20 semanas de embarazo. Podr sentir las pataditas ("quickening en ingls"). Hay un rpido aumento de peso. Puede segregar un lquido claro (calostro) de las mamas. Quizs sienta pequeas contracciones en el vientre (tero) Esto se conoce como falso trabajo de parto o contracciones de Braxton-Hicks. Es como una prctica del trabajo de parto que se produce cuando el beb est listo para salir. Generalmente los problemas de vmitos matinales ya se han superado hacia el final del primer trimestre. Algunas mujeres desarrollan pequeas manchas oscuras (que se denominan cloasma, mscara del embarazo) en la cara que normalmente se van luego del nacimiento del beb. La exposicin al sol empeora las manchas. Puede desarrollarse acn en algunas mujeres embarazadas, y puede desaparecer en aquellas que ya tienen acn. EXAMENES PRENATALES  Durante los exmenes prenatales, deber seguir realizando pruebas de sangre, segn avance el embarazo. Estas pruebas se realizan para controlar su salud y la del beb. Tambin se realizan anlisis de sangre para conocer los niveles de hemoglobina. La anemia (bajo nivel de hemoglobina) es frecuente durante el embarazo. Para prevenirla, se administran hierro y vitaminas. Tambin se le realizarn exmenes para saber si tiene diabetes entre las 24 y las 28 semanas del embarazo. Podrn repetirle algunas de las pruebas que le hicieron previamente.  En cada visita le medirn el tamao del tero. Esto se realiza para asegurarse de que el beb est creciendo correctamente de acuerdo al estado del embarazo.  Tambin en cada visita prenatal controlarn su presin arterial. Esto se realiza  para asegurarse de que no tenga toxemia.  Se controlar su orina para asegurarse de que no tenga infecciones, diabetes o protena en la orina.  Se controlar su peso regularmente para asegurarse que el aumento ocurre al ritmo indicado. Esto se hace para asegurarse que usted y el beb tienen una evolucin normal.  En algunas ocasiones se realiza una prueba de ultrasonido para confirmar el correcto desarrollo y evolucin del beb. Esta prueba se realiza con ondas sonoras inofensivas para el beb, de modo que el profesional pueda calcular ms precisamente la fecha del parto. Algunas veces se realizan pruebas especializadas del lquido amnitico que rodea al beb. Esta prueba se denomina amniocentesis. El lquido amnitico se obtiene introduciendo una aguja en el vientre (abdomen). Se realiza para controlar los cromosomas en aquellos casos en los que existe alguna preocupacin acerca de algn problema gentico que pueda sufrir el beb. En ocasiones se lleva a cabo cerca del final del embarazo, si es necesario inducir al parto. En este caso se realiza para asegurarse que los pulmones del beb estn lo suficientemente maduros como para que pueda vivir fuera del tero. CAMBIOS QUE OCURREN EN EL SEGUNDO TRIMESTRE DEL EMBARAZO Su organismo atravesar numerosos cambios durante el embarazo. Estos pueden variar de una persona a otra. Converse con el profesional que la asiste acerca los cambios que usted note y que la preocupen.  Durante el segundo trimestre probablemente sienta un aumento del apetito. Es normal tener "antojos" de ciertas comidas. Esto vara de una persona a otra y de un embarazo a otro.  El abdomen inferior comenzar a abultarse.  Podr tener la necesidad de orinar con ms frecuencia debido a   que el tero y el beb presionan sobre la vejiga. Tambin es frecuente contraer ms infecciones urinarias durante el embarazo. Puede evitarlas bebiendo gran cantidad de lquidos y vaciando la vejiga antes y  despus de mantener relaciones sexuales.  Podrn aparecer las primeras estras en las caderas, abdomen y mamas. Estos son cambios normales del cuerpo durante el embarazo. No existen medicamentos ni ejercicios que puedan prevenir estos cambios.  Es posible que comience a desarrollar venas inflamadas y abultadas (varices) en las piernas. El uso de medias de descanso, elevar sus pies durante 15 minutos, 3 a 4 veces al da y limitar la sal en su dieta ayuda a aliviar el problema.  Podr sentir acidez gstrica a medida que el tero crece y presiona contra el estmago. Puede tomar anticidos, con la autorizacin de su mdico, para aliviar este problema. Tambin es til ingerir pequeas comidas 4 a 5 veces al da.  La constipacin puede tratarse con un laxante o agregando fibra a su dieta. Beber grandes cantidades de lquidos, comer vegetales, frutas y granos integrales es de gran ayuda.  Tambin es beneficioso practicar actividad fsica. Si ha sido una persona activa hasta el embarazo, podr continuar con la mayora de las actividades durante el mismo. Si ha sido menos activa, puede ser beneficioso que comience con un programa de ejercicios, como realizar caminatas.  Puede desarrollar hemorroides hacia el final del segundo trimestre. Tomar baos de asiento tibios y utilizar cremas recomendadas por el profesional que lo asiste sern de ayuda para los problemas de hemorroides.  Tambin podr sentir dolor de espalda durante este momento de su embarazo. Evite levantar objetos pesados, utilice zapatos de taco bajo y mantenga una buena postura para ayudar a reducir los problemas de espalda.  Algunas mujeres embarazadas desarrollan hormigueo y adormecimiento de la mano y los dedos debido a la hinchazn y compresin de los ligamentos de la mueca (sndrome del tnel carpiano). Esto desaparece una vez que el beb nace.  Como sus pechos se agrandan, necesitar un sujetador ms grande. Use un sostn de soporte,  cmodo y de algodn. No utilice un sostn para amamantar hasta el ltimo mes de embarazo si va a amamantar al beb.  Podr observar una lnea oscura desde el ombligo hacia la zona pbica denominada linea nigra.  Podr observar que sus mejillas se ponen coloradas debido al aumento de flujo sanguneo en la cara.  Podr desarrollar "araitas" en la cara, cuello y pecho. Esto desaparece una vez que el beb nace. INSTRUCCIONES PARA EL CUIDADO DOMICILIARIO  Es extremadamente importante que evite el cigarrillo, hierbas medicinales, alcohol y las drogas no prescriptas durante el embarazo. Estas sustancias qumicas afectan la formacin y el desarrollo del beb. Evite estas sustancias durante todo el embarazo para asegurar el nacimiento de un beb sano.  La mayor parte de los cuidados que se aconsejan son los mismos que los indicados para el primer trimestre del embarazo. Cumpla con las citas tal como se le indic. Siga las instrucciones del profesional que lo asiste con respecto al uso de los medicamentos, el ejercicio y la dieta.  Durante el embarazo debe obtener nutrientes para usted y para su beb. Consuma alimentos balanceados a intervalos regulares. Elija alimentos como carne, pescado, leche y otros productos lcteos descremados, vegetales, frutas, panes integrales y cereales. El profesional le informar cul es el aumento de peso ideal.  Las relaciones sexuales fsicas pueden continuarse hasta cerca del fin del embarazo si no existen otros problemas. Estos problemas pueden ser la prdida temprana (  prematura) de lquido amnitico de las membranas, sangrado vaginal, dolor abdominal u otros problemas mdicos o del embarazo.  Realice actividad fsica todos los das, si no tiene restricciones. Consulte con el profesional que la asiste si no sabe con certeza si determinados ejercicios son seguros. El mayor aumento de peso tiene lugar durante los ltimos 2 trimestres del embarazo. El ejercicio la ayudar  a:  Controlar su peso.  Ponerla en forma para el parto.  Ayudarla a perder peso luego de haber dado a luz.  Use un buen sostn o como los que se usan para hacer deportes para aliviar la sensibilidad de las mamas. Tambin puede serle til si lo usa mientras duerme. Si pierde calostro, podr utilizar apsitos en el sostn.  No utilice la baera con agua caliente, baos turcos y saunas durante el embarazo.  Utilice el cinturn de seguridad sin excepcin cuando conduzca. Este la proteger a usted y al beb en caso de accidente.  Evite comer carne cruda, queso crudo, y el contacto con los utensilios y desperdicios de los gatos. Estos elementos contienen grmenes que pueden causar defectos de nacimiento en el beb.  El segundo trimestre es un buen momento para visitar a su dentista y evaluar su salud dental si an no lo ha hecho. Es importante mantener los dientes limpios. Utilice un cepillo de dientes blando. Cepllese ms suavemente durante el embarazo.  Es ms fcil perder algo de orina durante el embarazo. Apretar y fortalecer los msculos de la pelvis la ayudar con este problema. Practique detener la miccin cuando est en el bao. Estos son los mismos msculos que necesita fortalecer. Son tambin los mismos msculos que utiliza cuando trata de evitar los gases. Puede practicar apretando estos msculos 10 veces, y repetir esto 3 veces por da aproximadamente. Una vez que conozca qu msculos debe apretar, no realice estos ejercicios durante la miccin. Puede favorecerle una infeccin si la orina vuelve hacia atrs.  Pida ayuda si tiene necesidades econmicas, de asesoramiento o nutricionales durante el embarazo. El profesional podr ayudarla con respecto a estas necesidades, o derivarla a otros especialistas.  La piel puede ponerse grasa. Si esto sucede, lvese la cara con un jabn suave, utilice un humectante no graso y maquillaje con base de aceite o crema. CONSUMO DE MEDICAMENTOS Y DROGAS  DURANTE EL EMBARAZO  Contine tomando las vitaminas apropiadas para esta etapa tal como se le indic. Las vitaminas deben contener un miligramo de cido flico y deben suplementarse con hierro. Guarde todas las vitaminas fuera del alcance de los nios. La ingestin de slo un par de vitaminas o tabletas que contengan hierro puede ocasionar la muerte en un beb o en un nio pequeo.  Evite el uso de medicamentos, inclusive los de venta libre y hierbas que no hayan sido prescriptos o indicados por el profesional que la asiste. Algunos medicamentos pueden causar problemas fsicos al beb. Utilice los medicamentos de venta libre o de prescripcin para el dolor, el malestar o la fiebre, segn se lo indique el profesional que lo asiste. No utilice aspirina.  El consumo de alcohol est relacionado con ciertos defectos de nacimiento. Esto incluye el sndrome de alcoholismo fetal. Debe evitar el consumo de alcohol en cualquiera de sus formas. El cigarrillo causa nacimientos prematuros y bebs de bajo peso. El uso de drogas recreativas est absolutamente prohibido. Son muy nocivas para el beb. Un beb que nace de una madre adicta, ser adicto al nacer. Ese beb tendr los mismos sntomas de abstinencia que un adulto.    Infrmele al profesional si consume alguna droga.  No consuma drogas ilegales. Pueden causarle mucho dao al beb. SOLICITE ATENCIN MDICA SI: Tiene preguntas o preocupaciones durante su embarazo. Es mejor que llame para consultar las dudas que esperar hasta su prxima visita prenatal. De esta forma se sentir ms tranquila.  SOLICITE ATENCIN MDICA DE INMEDIATO SI:  La temperatura oral se eleva sin motivo por encima de 102 F (38.9 C) o segn le indique el profesional que lo asiste.  Tiene una prdida de lquido por la vagina (canal de parto). Si sospecha una ruptura de las membranas, tmese la temperatura y llame al profesional para informarlo sobre esto.  Observa unas pequeas manchas,  una hemorragia vaginal o elimina cogulos. Notifique al profesional acerca de la cantidad y de cuntos apsitos est utilizando. Unas pequeas manchas de sangre son algo comn durante el embarazo, especialmente despus de mantener relaciones sexuales.  Presenta un olor desagradable en la secrecin vaginal y observa un cambio en el color, de transparente a blanco.  Contina con las nuseas y no obtiene alivio de los remedios indicados. Vomita sangre o algo similar a la borra del caf.  [place] o sube ms de 900 g. en una semana, o segn lo indicado por el profesional que la asiste.  Observa que se le hinchan el rostro, las manos, los pies o las piernas.  Ha estado expuesta a la rubola y no ha sufrido la enfermedad.  Ha estado expuesta a la quinta enfermedad o a la varicela.  Presenta dolor abdominal. Las molestias en el ligamento redondo son una causa no cancerosa (benigna) frecuente de dolor abdominal durante el embarazo. El profesional que la asiste deber evaluarla.  Presenta dolor de cabeza intenso que no se alivia.  Presenta fiebre, diarrea, dolor al orinar o le falta la respiracin.  Presenta dificultad para ver, visin borrosa, o visin doble.  Sufre una cada, un accidente de trnsito o cualquier tipo de trauma.  Vive en un hogar en el que existe violencia fsica o mental. Document Released: 05/21/2005 Document Revised: 05/05/2012 [employer] Patient Information 2014 [employer].  

## 2013-04-21 NOTE — Progress Notes (Signed)
U/S scheduled 05/19/13 at 830 am.

## 2013-04-21 NOTE — Progress Notes (Signed)
Pulse-   80 Pt reports going to MAU for bleeding today is having some spotting

## 2013-04-21 NOTE — Progress Notes (Signed)
New OB visit. Will move to low risk clinic. See other note.  Subjective:    Sophia Decker is a G2P0010 [redacted]w[redacted]d being seen today for her first obstetrical visit.  Her obstetrical history is significant for first trimester bleeding with normal exam, unknown cause. Patient does intend to breast feed. Pregnancy history fully reviewed.  Patient reports no complaints.  Filed Vitals:   04/21/13 0813  BP: 110/65  Temp: 98.8 F (37.1 C)  Weight: 72.167 kg (159 lb 1.6 oz)    HISTORY: OB History  Gravida Para Term Preterm AB SAB TAB Ectopic Multiple Living  2 0 0 0 1  1   0    # Outcome Date GA Lbr Len/2nd Weight Sex Delivery Anes PTL Lv  2 CUR           1 TAB 2012 [redacted]w[redacted]d            Comments: surgical Ab in [place]     Past Medical History  Diagnosis Date  . Medical history non-contributory    History reviewed. No pertinent past surgical history. Family History  Problem Relation Age of Onset  . Cancer Father   . Migraines Mother      Exam    Uterus:     Pelvic Exam:    Perineum: No Hemorrhoids   Vulva: Bartholin's, Urethra, Skene's normal   Vagina:  normal mucosa, normal discharge   pH:    Cervix: no bleeding following Pap and no cervical motion tenderness   Adnexa: no mass, fullness, tenderness   Bony Pelvis: gynecoid  System: Breast:  normal appearance, no masses or tenderness   Skin: normal coloration and turgor, no rashes    Neurologic: oriented, grossly non-focal   Extremities: normal strength, tone, and muscle mass   HEENT neck supple with midline trachea   Mouth/Teeth mucous membranes moist, pharynx normal without lesions   Neck supple and no masses   Cardiovascular: regular rate and rhythm   Respiratory:  appears well, vitals normal, no respiratory distress, acyanotic, normal RR, ear and throat exam is normal, neck free of mass or lymphadenopathy, chest clear, no wheezing, crepitations, rhonchi, normal symmetric air entry   Abdomen: soft, non-tender; bowel sounds  normal; no masses,  no organomegaly   Urinary: urethral meatus normal      Assessment:    Pregnancy: G2P0010 Patient Active Problem List   Diagnosis Date Noted  . First trimester bleeding 03/24/2013  . Supervision of low-risk pregnancy 03/24/2013  . Cervicitis 03/10/2013  . Vaginal bleeding 03/10/2013        Plan:     Initial labs drawn. Prenatal vitamins. Problem list reviewed and updated. Genetic Screening discussed Quad Screen: requested.  Need to do at next visit  Ultrasound discussed; fetal survey: requested.  Follow up in 4 weeks. 50% of 30 min visit spent on counseling and coordination of care.     [facility] 04/21/2013

## 2013-04-30 ENCOUNTER — Encounter (HOSPITAL_COMMUNITY): Payer: Self-pay | Admitting: *Deleted

## 2013-04-30 ENCOUNTER — Inpatient Hospital Stay (HOSPITAL_COMMUNITY)
Admission: AD | Admit: 2013-04-30 | Discharge: 2013-05-01 | Disposition: A | Discharge status: Home / Self Care | Payer: Self-pay | Source: Ambulatory Visit | Attending: Family Medicine | Admitting: Family Medicine

## 2013-04-30 DIAGNOSIS — O4692 Antepartum hemorrhage, unspecified, second trimester: Secondary | ICD-10-CM

## 2013-04-30 DIAGNOSIS — R1032 Left lower quadrant pain: Secondary | ICD-10-CM | POA: Insufficient documentation

## 2013-04-30 DIAGNOSIS — O209 Hemorrhage in early pregnancy, unspecified: Secondary | ICD-10-CM | POA: Insufficient documentation

## 2013-04-30 NOTE — MAU Note (Signed)
Patient complains of heavy vaginal bleeding and passing large clots that started 20 minutes ago. Also complains of pain in left lower quadrant for the last two hours.

## 2013-05-01 DIAGNOSIS — O4690 Antepartum hemorrhage, unspecified, unspecified trimester: Secondary | ICD-10-CM

## 2013-05-01 LAB — URINALYSIS, ROUTINE W REFLEX MICROSCOPIC
Bilirubin Urine: NEGATIVE
Ketones, ur: NEGATIVE mg/dL
Nitrite: NEGATIVE
Protein, ur: NEGATIVE mg/dL
Urobilinogen, UA: 0.2 mg/dL (ref 0.0–1.0)
pH: 7 (ref 5.0–8.0)

## 2013-05-01 LAB — URINE MICROSCOPIC-ADD ON

## 2013-05-01 LAB — CBC
MCH: 30.8 pg (ref 26.0–34.0)
MCHC: 35.6 g/dL (ref 30.0–36.0)
MCV: 86.7 fL (ref 78.0–100.0)
Platelets: 217 10*3/uL (ref 150–400)
RDW: 13.2 % (ref 11.5–15.5)

## 2013-05-01 NOTE — MAU Provider Note (Signed)
  History     CSN: [id number]  Arrival date and time: 04/30/13 2312   First Provider Initiated Contact with Patient 05/01/13 0014      Chief Complaint  Patient presents with  . Vaginal Bleeding   Vaginal Bleeding    Sophia Decker is a 22 y.o. G2P0010 at [redacted]w[redacted]d who presents today with vaginal bleeding. She states that around 2300 she passed a small clot, and had pink bleeding. She denies any recent intercourse. She denies any pain.   Past Medical History  Diagnosis Date  . Medical history non-contributory     History reviewed. No pertinent past surgical history.  Family History  Problem Relation Age of Onset  . Cancer Father   . Migraines Mother     History  Substance Use Topics  . Smoking status: Never Smoker   . Smokeless tobacco: Never Used  . Alcohol Use: No    Allergies: No Known Allergies  Prescriptions prior to admission  Medication Sig Dispense Refill  . Prenatal Vit-Fe Fumarate-FA (PRENATAL MULTIVITAMIN) TABS Take 1 tablet by mouth daily at 12 noon. Gummy pre-natal vitamin        Review of Systems  Genitourinary: Positive for vaginal bleeding.   Physical Exam   Blood pressure 123/65, pulse 98, temperature 98.2 F (36.8 C), temperature source Oral, resp. rate 18, last menstrual period 01/06/2013, SpO2 100.00%.  Physical Exam  Nursing note and vitals reviewed. Constitutional: She is oriented to person, place, and time. She appears well-developed and well-nourished. No distress.  Cardiovascular: Normal rate.   Respiratory: Effort normal.  GI: Soft. There is no tenderness.  Genitourinary:   External: no lesion Vagina: small amount of pink blood Cervix: pink, smooth, no CMT, closed/thick/high no active bleeding seen.  Uterus: AGA, FHT auscultated with doppler.     Neurological: She is alert and oriented to person, place, and time.  Skin: Skin is warm and dry.  Psychiatric: She has a normal mood and affect.    MAU Course  Procedures  Results  for orders placed during the hospital encounter of 04/30/13 (from the past 24 hour(s))  CBC     Status: Abnormal   Collection Time    05/01/13 12:19 AM      Result Value Range   WBC 11.9 (*) 4.0 - 10.5 K/uL   RBC 3.60 (*) 3.87 - 5.11 MIL/uL   Hemoglobin 11.1 (*) 12.0 - 15.0 g/dL   HCT 40.9 (*) 81.1 - 91.4 %   MCV 86.7  78.0 - 100.0 fL   MCH 30.8  26.0 - 34.0 pg   MCHC 35.6  30.0 - 36.0 g/dL   RDW 78.2  95.6 - 21.3 %   Platelets 217  150 - 400 K/uL     Assessment and Plan   1. Vaginal bleeding in pregnancy, second trimester    Pelvic rest FU with the clinic as planned Bleeding precautions reviewed Return to MAU as needed   Tawnya Crook 05/01/2013, 12:23 AM

## 2013-05-01 NOTE — MAU Provider Note (Signed)
Chart reviewed and agree with management and plan.  

## 2013-05-05 ENCOUNTER — Other Ambulatory Visit: Payer: Medicaid Other

## 2013-05-13 ENCOUNTER — Encounter: Payer: Self-pay | Admitting: *Deleted

## 2013-05-13 DIAGNOSIS — O209 Hemorrhage in early pregnancy, unspecified: Secondary | ICD-10-CM

## 2013-05-18 ENCOUNTER — Ambulatory Visit (INDEPENDENT_AMBULATORY_CARE_PROVIDER_SITE_OTHER): Payer: Self-pay | Admitting: Advanced Practice Midwife

## 2013-05-18 VITALS — BP 122/70 | Wt 165.0 lb

## 2013-05-18 DIAGNOSIS — O209 Hemorrhage in early pregnancy, unspecified: Secondary | ICD-10-CM

## 2013-05-18 LAB — POCT URINALYSIS DIP (DEVICE)
Glucose, UA: NEGATIVE mg/dL
Hgb urine dipstick: NEGATIVE
Nitrite: NEGATIVE
Protein, ur: NEGATIVE mg/dL
Specific Gravity, Urine: 1.02 (ref 1.005–1.030)
Urobilinogen, UA: 0.2 mg/dL (ref 0.0–1.0)

## 2013-05-18 NOTE — Progress Notes (Signed)
Doing well.  Denies vaginal bleeding, LOF, cramping/contractions.  Rescheduled anatomy scan for >19 weeks.

## 2013-05-18 NOTE — Progress Notes (Signed)
Pulse: 72

## 2013-05-19 ENCOUNTER — Ambulatory Visit (HOSPITAL_COMMUNITY): Payer: MEDICAID

## 2013-06-02 ENCOUNTER — Other Ambulatory Visit: Payer: Self-pay | Admitting: Advanced Practice Midwife

## 2013-06-02 ENCOUNTER — Ambulatory Visit (HOSPITAL_COMMUNITY)
Admission: RE | Admit: 2013-06-02 | Discharge: 2013-06-02 | Disposition: A | Discharge status: Home / Self Care | Payer: Medicaid Other | Source: Ambulatory Visit | Attending: Advanced Practice Midwife | Admitting: Advanced Practice Midwife

## 2013-06-02 DIAGNOSIS — O209 Hemorrhage in early pregnancy, unspecified: Secondary | ICD-10-CM

## 2013-06-02 DIAGNOSIS — O358XX0 Maternal care for other (suspected) fetal abnormality and damage, not applicable or unspecified: Secondary | ICD-10-CM | POA: Insufficient documentation

## 2013-06-02 DIAGNOSIS — Z1389 Encounter for screening for other disorder: Secondary | ICD-10-CM | POA: Insufficient documentation

## 2013-06-02 DIAGNOSIS — Z363 Encounter for antenatal screening for malformations: Secondary | ICD-10-CM | POA: Insufficient documentation

## 2013-06-06 ENCOUNTER — Other Ambulatory Visit (HOSPITAL_COMMUNITY): Payer: Self-pay | Admitting: Nurse Practitioner

## 2013-06-06 ENCOUNTER — Encounter: Payer: Self-pay | Admitting: Advanced Practice Midwife

## 2013-06-06 DIAGNOSIS — O4413 Placenta previa with hemorrhage, third trimester: Secondary | ICD-10-CM

## 2013-06-15 ENCOUNTER — Encounter: Payer: Self-pay | Admitting: Obstetrics and Gynecology

## 2013-07-19 ENCOUNTER — Ambulatory Visit (HOSPITAL_COMMUNITY)
Admission: RE | Admit: 2013-07-19 | Discharge: 2013-07-19 | Disposition: A | Discharge status: Home / Self Care | Payer: Medicaid Other | Source: Ambulatory Visit | Attending: Nurse Practitioner | Admitting: Nurse Practitioner

## 2013-07-19 ENCOUNTER — Ambulatory Visit (HOSPITAL_COMMUNITY): Admission: RE | Admit: 2013-07-19 | Payer: Medicaid Other | Source: Ambulatory Visit

## 2013-07-19 ENCOUNTER — Other Ambulatory Visit (HOSPITAL_COMMUNITY): Payer: Self-pay | Admitting: Nurse Practitioner

## 2013-07-19 DIAGNOSIS — O4413 Placenta previa with hemorrhage, third trimester: Secondary | ICD-10-CM

## 2013-07-19 DIAGNOSIS — O4400 Placenta previa specified as without hemorrhage, unspecified trimester: Secondary | ICD-10-CM | POA: Insufficient documentation

## 2013-07-19 DIAGNOSIS — Z3689 Encounter for other specified antenatal screening: Secondary | ICD-10-CM | POA: Insufficient documentation

## 2013-08-01 ENCOUNTER — Ambulatory Visit (HOSPITAL_COMMUNITY): Payer: Self-pay

## 2013-08-12 ENCOUNTER — Encounter (HOSPITAL_COMMUNITY): Payer: Self-pay | Admitting: *Deleted

## 2013-08-12 ENCOUNTER — Inpatient Hospital Stay (HOSPITAL_COMMUNITY)
Admission: AD | Admit: 2013-08-12 | Discharge: 2013-08-17 | DRG: 775 | Disposition: A | Discharge status: Home / Self Care | Payer: Medicaid Other | Source: Ambulatory Visit | Attending: Obstetrics & Gynecology | Admitting: Obstetrics & Gynecology

## 2013-08-12 DIAGNOSIS — Z3490 Encounter for supervision of normal pregnancy, unspecified, unspecified trimester: Secondary | ICD-10-CM

## 2013-08-12 DIAGNOSIS — O4290 Premature rupture of membranes, unspecified as to length of time between rupture and onset of labor, unspecified weeks of gestation: Principal | ICD-10-CM | POA: Diagnosis present

## 2013-08-12 DIAGNOSIS — Z3493 Encounter for supervision of normal pregnancy, unspecified, third trimester: Secondary | ICD-10-CM

## 2013-08-12 HISTORY — DX: Premature rupture of membranes, unspecified as to length of time between rupture and onset of labor, unspecified weeks of gestation: O42.90

## 2013-08-12 MED ORDER — AMOXICILLIN 500 MG PO CAPS
500.0000 mg | ORAL_CAPSULE | Freq: Three times a day (TID) | ORAL | Status: DC
  Administered 2013-08-15 (×3): 500 mg via ORAL
  Filled 2013-08-12 (×5): qty 1

## 2013-08-12 MED ORDER — ERYTHROMYCIN LACTOBIONATE 500 MG IV SOLR
250.0000 mg | Freq: Four times a day (QID) | INTRAVENOUS | Status: AC
  Administered 2013-08-13 – 2013-08-14 (×8): 250 mg via INTRAVENOUS
  Filled 2013-08-12 (×9): qty 5

## 2013-08-12 MED ORDER — MAGNESIUM SULFATE 40 G IN LACTATED RINGERS - SIMPLE
2.0000 g/h | INTRAVENOUS | Status: DC
  Administered 2013-08-13 (×2): 2 g/h via INTRAVENOUS
  Filled 2013-08-12 (×2): qty 500

## 2013-08-12 MED ORDER — PRENATAL MULTIVITAMIN CH
1.0000 | ORAL_TABLET | Freq: Every day | ORAL | Status: DC
  Administered 2013-08-13 – 2013-08-15 (×3): 1 via ORAL
  Filled 2013-08-12 (×3): qty 1

## 2013-08-12 MED ORDER — CALCIUM CARBONATE ANTACID 500 MG PO CHEW: 2.0000 | CHEWABLE_TABLET | ORAL | Status: DC | PRN

## 2013-08-12 MED ORDER — ACETAMINOPHEN 325 MG PO TABS: 650.0000 mg | ORAL_TABLET | ORAL | Status: DC | PRN

## 2013-08-12 MED ORDER — DOCUSATE SODIUM 100 MG PO CAPS
100.0000 mg | ORAL_CAPSULE | Freq: Every day | ORAL | Status: DC
  Administered 2013-08-13 – 2013-08-15 (×3): 100 mg via ORAL
  Filled 2013-08-12 (×4): qty 1

## 2013-08-12 MED ORDER — BETAMETHASONE SOD PHOS & ACET 6 (3-3) MG/ML IJ SUSP
12.0000 mg | INTRAMUSCULAR | Status: AC
  Administered 2013-08-13 – 2013-08-14 (×2): 12 mg via INTRAMUSCULAR
  Filled 2013-08-12 (×2): qty 2

## 2013-08-12 MED ORDER — ERYTHROMYCIN BASE 250 MG PO TABS
250.0000 mg | ORAL_TABLET | Freq: Four times a day (QID) | ORAL | Status: DC
  Administered 2013-08-15 (×4): 250 mg via ORAL
  Filled 2013-08-12 (×7): qty 1

## 2013-08-12 MED ORDER — ZOLPIDEM TARTRATE 5 MG PO TABS: 5.0000 mg | ORAL_TABLET | Freq: Every evening | ORAL | Status: DC | PRN

## 2013-08-12 MED ORDER — SODIUM CHLORIDE 0.9 % IV SOLN
2.0000 g | Freq: Four times a day (QID) | INTRAVENOUS | Status: AC
  Administered 2013-08-13 – 2013-08-14 (×8): 2 g via INTRAVENOUS
  Filled 2013-08-12 (×9): qty 2000

## 2013-08-12 MED ORDER — MAGNESIUM SULFATE BOLUS VIA INFUSION
6.0000 g | Freq: Once | INTRAVENOUS | Status: AC
  Administered 2013-08-13: 6 g via INTRAVENOUS
  Filled 2013-08-12: qty 500

## 2013-08-12 MED ORDER — LACTATED RINGERS IV SOLN
INTRAVENOUS | Status: DC
  Administered 2013-08-13 – 2013-08-15 (×4): via INTRAVENOUS

## 2013-08-12 NOTE — MAU Note (Signed)
Leaking fld for 3 days. Saw doctor yesterday and told everything ok.

## 2013-08-13 ENCOUNTER — Inpatient Hospital Stay (HOSPITAL_COMMUNITY): Payer: Medicaid Other

## 2013-08-13 LAB — CBC
Hemoglobin: 12.3 g/dL (ref 12.0–15.0)
MCH: 31.6 pg (ref 26.0–34.0)
MCHC: 35.2 g/dL (ref 30.0–36.0)
MCV: 89.7 fL (ref 78.0–100.0)
RBC: 3.89 MIL/uL (ref 3.87–5.11)

## 2013-08-13 LAB — OB RESULTS CONSOLE GBS: GBS: NEGATIVE

## 2013-08-13 LAB — TYPE AND SCREEN: ABO/RH(D): O POS

## 2013-08-13 LAB — ABO/RH: ABO/RH(D): O POS

## 2013-08-13 MED ORDER — PRENATAL MULTIVITAMIN CH: 1.0000 | ORAL_TABLET | Freq: Every day | ORAL | Status: DC

## 2013-08-13 NOTE — H&P (Signed)
Sophia Decker is a 22 y.o. female presenting for PPROM. Pt states that she felt like she had some fluid leaking for 3 days that was off and on and then since this evening she had a gush of clear fluid and it has been constantly leaking. She had about 5 contractions this morning but has had rare contractions since then. She denies vaginal bleeding and reports good fetal movement.  Appeared to be grossly ruptured in MAU and fluid collected was positive for ferning. No meconium noted.  Maternal Medical History:  Reason for admission: Rupture of membranes.  Nausea.  Contractions: Frequency: rare.    Fetal activity: Perceived fetal activity is normal.   Last perceived fetal movement was within the past hour.    Prenatal complications: Placental abnormality (low-lying placenta with bleeding early in pregnancy, placenta above cervical os on 07/20/13 [place]).   No bleeding, cholelithiasis, HIV, hypertension, infection, IUGR, nephrolithiasis, oligohydramnios, polyhydramnios, pre-eclampsia, preterm labor, substance abuse, thrombocytopenia or thrombophilia.   Prenatal Complications - Diabetes: none.    OB History   Grav Para Term Preterm Abortions TAB SAB Ect Mult Living   2 0 0 0 1 1    0     Past Medical History  Diagnosis Date  . Medical history non-contributory    Past Surgical History  Procedure Laterality Date  . No past surgeries     Family History: family history includes Cancer in her father; Migraines in her mother. Patient reports family history of preterm labor. Social History:  reports that she has never smoked. She has never used smokeless tobacco. She reports that she does not drink alcohol or use illicit drugs.   Prenatal Transfer Tool  Maternal Diabetes: No Genetic Screening: Normal Maternal Ultrasounds/Referrals: Normal Fetal Ultrasounds or other Referrals:  None Maternal Substance Abuse:  No Significant Maternal Medications:  None Significant Maternal Lab Results:  Lab  values include: Other:  GBS pending Other Comments:  No 1 hour glucose screen.  Review of Systems  Constitutional: Negative for fever, chills and malaise/fatigue.  HENT: Negative for nosebleeds and sore throat.   Eyes: Negative for blurred vision, double vision and photophobia.  Respiratory: Negative for cough, shortness of breath and wheezing.   Cardiovascular: Negative for chest pain, palpitations and leg swelling.  Gastrointestinal: Negative for heartburn, nausea, vomiting, abdominal pain, diarrhea and constipation.  Genitourinary: Negative for dysuria, urgency, frequency, hematuria and flank pain.  Musculoskeletal: Negative for back pain, falls, joint pain and myalgias.  Skin: Negative for itching and rash.  Neurological: Negative for dizziness, tingling, tremors, sensory change, focal weakness, weakness and headaches.      Blood pressure 117/68, pulse 95, temperature 98 F (36.7 C), resp. rate 20, height 5\' 8"  (1.727 m), weight 81.92 kg (180 lb 9.6 oz), last menstrual period 01/06/2013, SpO2 98.00%. Maternal Exam:  Uterine Assessment: Contraction frequency is rare.      Fetal Exam Fetal Monitor Review: Baseline rate: 150.  Variability: moderate (6-25 bpm).   Pattern: accelerations present and no decelerations.    Fetal State Assessment: Category I - tracings are normal.     Physical Exam  Nursing note and vitals reviewed. Constitutional: She is oriented to person, place, and time. She appears well-developed and well-nourished. No distress.  HENT:  Head: Normocephalic and atraumatic.  Nose: Nose normal.  Eyes: Pupils are equal, round, and reactive to light. Right eye exhibits no discharge. Left eye exhibits no discharge. No scleral icterus.  Neck: Normal range of motion. Neck supple.  Cardiovascular: Normal  rate, regular rhythm, normal heart sounds and intact distal pulses.   Respiratory: Effort normal and breath sounds normal. She has no wheezes. She has no rales.   GI: Soft. There is no tenderness. There is no rebound and no guarding.  Musculoskeletal: Normal range of motion. She exhibits no edema and no tenderness.  Neurological: She is alert and oriented to person, place, and time. She has normal reflexes.  Skin: Skin is warm and dry. No rash noted. She is not diaphoretic. No erythema. No pallor.  Psychiatric: She has a normal mood and affect. Her behavior is normal.    Prenatal labs: ABO, Rh: O/POS/-- (07/31 1030) Antibody: NEG (07/31 1030) Rubella: 1.95 (07/31 1030) immune RPR: NON REAC (07/31 1030)  HBsAg: NEGATIVE (07/31 1030)  HIV: NON REACTIVE (07/31 1030)  GBS:   pending  Assessment/Plan: IUP at [redacted]w[redacted]d PPROM Reactive NST  Admit to antenatal BMZ Mag sulfate while receiving steroids Erythromycin & ampicillin for prophylaxis x7 days Monitor for contractions NSTs every shift Limited OB US for fetal presentation Consult NICU  Pior, Jearld Lesch 08/13/2013, 1:48 AM  I have seen and examined this patient and I agree with the above. Sophia Decker 5:49 AM 08/13/2013

## 2013-08-13 NOTE — Plan of Care (Signed)
Problem: Consults Goal: Birthing Suites Patient Information Press F2 to bring up selections list   Pt < [redacted] weeks EGA     

## 2013-08-13 NOTE — Progress Notes (Signed)
FACULTY PRACTICE ANTEPARTUM(COMPREHENSIVE) NOTE  Sophia Decker is a 22 y.o. G2P0010 at [redacted]w[redacted]d by early ultrasound who is admitted for PROM.   Fetal presentation is cephalic. Length of Stay:  1  Days  Subjective: Pt having a few mild contractions on EFM, q 20 min, increased from admit, not noted by pt Patient reports the fetal movement as active. Patient reports uterine contraction  activity as none, but a few noted on EFM, irregular. Patient reports  vaginal bleeding as none. Patient describes fluid per vagina as Clear.  Vitals:  Blood pressure 114/66, pulse 80, temperature 98.3 F (36.8 C), temperature source Oral, resp. rate 18, height 5\' 8"  (1.727 m), weight 81.92 kg (180 lb 9.6 oz), last menstrual period 01/06/2013, SpO2 98.00%. Physical Examination:  General appearance - alert, well appearing, and in no distress Heart - normal rate and regular rhythm Abdomen - soft, nontender, nondistended Fundal Height:  size equals dates Cervical Exam: Not evaluated. A  fetal presentation is cephalic and documented per u/s. Extremities: extremities normal, atraumatic, no cyanosis or edema and Homans sign is negative, no sign of DVT with DTRs 2+ bilaterally Membranes:intact, ruptured, clear fluid  Fetal Monitoring:  Baseline: 145 bpm, Variability: Good {> 6 bpm), Accelerations: Reactive and Decelerations: Absent  Labs:  Results for orders placed during the hospital encounter of 08/12/13 (from the past 24 hour(s))  POCT FERN TEST   Collection Time    08/12/13 11:52 PM      Result Value Range   POCT Fern Test Positive = ruptured amniotic membanes    CBC   Collection Time    08/13/13 12:20 AM      Result Value Range   WBC 10.3  4.0 - 10.5 K/uL   RBC 3.89  3.87 - 5.11 MIL/uL   Hemoglobin 12.3  12.0 - 15.0 g/dL   HCT 82.9 (*) 56.2 - 13.0 %   MCV 89.7  78.0 - 100.0 fL   MCH 31.6  26.0 - 34.0 pg   MCHC 35.2  30.0 - 36.0 g/dL   RDW 86.5  78.4 - 69.6 %   Platelets 195  150 - 400 K/uL  TYPE  AND SCREEN   Collection Time    08/13/13 12:20 AM      Result Value Range   ABO/RH(D) O POS     Antibody Screen NEG     Sample Expiration 08/16/2013       Medications:  Scheduled . ampicillin (OMNIPEN) IV  2 g Intravenous Q6H   Followed by  . [START ON 08/15/2013] amoxicillin  500 mg Oral Q8H  . betamethasone acetate-betamethasone sodium phosphate  12 mg Intramuscular Q24H  . docusate sodium  100 mg Oral Daily  . erythromycin  250 mg Intravenous Q6H   Followed by  . [START ON 08/15/2013] erythromycin  250 mg Oral Q6H  . prenatal multivitamin  1 tablet Oral Q1200   I have reviewed the patient's current medications.  ASSESSMENT: Patient Active Problem List   Diagnosis Date Noted  . Amniotic fluid leaking 08/12/2013  . First trimester bleeding 03/24/2013  . Supervision of low-risk pregnancy 03/24/2013  . Cervicitis 03/10/2013  . Vaginal bleeding 03/10/2013    PLAN: Complete BMZ and continue Mag sulfate, may need to continue Mag x 48 hrs, given the contractions, and keep as inpt til delivery Neonatology consult today Tour of NICU  Belenda Alviar V 08/13/2013,7:53 AM

## 2013-08-13 NOTE — H&P (Signed)
`````  Attestation of Attending Supervision of Advanced Practitioner: Evaluation and management procedures were performed by the PA/NP/CNM/OB Fellow under my supervision/collaboration. Chart reviewed and agree with management and plan.  Ultrasound shows several fluid pockets, decreased, but fluid pockets up to 3.2 cm noted   Sophia Decker V 08/13/2013 6:48 AM

## 2013-08-13 NOTE — Consult Note (Signed)
Neonatology Consult to Antenatal Patient: 08/13/2013 10:56 PM    Requested by Dr Shawnie Pons to consult for PPROM at 30 3/[redacted] weeks gestation.  Ms. Charell Faulk is a 22 y.o. G2P0 presenting for PPROM. Pt states that she felt like she had some fluid leaking for 3 days that was off and on and then since last night she had a gush of clear fluid and it has been constantly leaking.  She is presently on Ampicillin, Erythromycin, MgSO4 and a course of Betamethasone.  I spoke with Ms. Tedd Sias and FOB in Room 152.   I discussed in detail what to expect in case of possible delivery of the infant in the next few days including DR management, Morbidity and Mortality, possible respiratory complication and need for support, IV access, sepsis work-up, feeding (BF or formula), length of stay and long-term outcome.  They had a few questions, which I answered.   I offered a NICU tour to FOB and would be glad to come back if they has more questions later.  Thank you for asking me to see this patient.   Overton Mam, MD (Attending Neonatologist)  Time spent face to face :   20 minutes

## 2013-08-14 DIAGNOSIS — O4290 Premature rupture of membranes, unspecified as to length of time between rupture and onset of labor, unspecified weeks of gestation: Secondary | ICD-10-CM

## 2013-08-14 NOTE — Progress Notes (Signed)
Patient ID: Caden Fatica, female   DOB: [birth year], 22 y.o.   MRN: [id number] FACULTY PRACTICE ANTEPARTUM(COMPREHENSIVE) NOTE  Fraida Veldman is a 22 y.o. G2P0010 at [redacted]w[redacted]d by best clinical estimate who is admitted for PROM.   Fetal presentation is cephalic. Length of Stay:  2  Days  Subjective: No pain. Feels well. Patient reports the fetal movement as active. Patient reports uterine contraction  activity as irregular, every 10 minutes. Patient reports  vaginal bleeding as none. Patient describes fluid per vagina as Clear.  Vitals:  Blood pressure 104/54, pulse 98, temperature 97.9 F (36.6 C), temperature source Oral, resp. rate 18, height 5\' 8"  (1.727 m), weight 180 lb 9.6 oz (81.92 kg), last menstrual period 01/06/2013, SpO2 98.00%. Physical Examination:  General appearance - alert, well appearing, and in no distress Abdomen - soft, nontender, nondistended, no masses or organomegaly gravid Fundal Height:  size equals dates Extremities: extremities normal, atraumatic, no cyanosis or edema  Membranes:ruptured, clear fluid  Fetal Monitoring:  Baseline: 125 bpm, Variability: Good {> 6 bpm), Accelerations: Reactive and Decelerations: Variable: mild   Medications:  Scheduled . ampicillin (OMNIPEN) IV  2 g Intravenous Q6H   Followed by  . [START ON 08/15/2013] amoxicillin  500 mg Oral Q8H  . docusate sodium  100 mg Oral Daily  . erythromycin  250 mg Intravenous Q6H   Followed by  . [START ON 08/15/2013] erythromycin  250 mg Oral Q6H  . prenatal multivitamin  1 tablet Oral Q1200   I have reviewed the patient's current medications.  ASSESSMENT: Patient Active Problem List   Diagnosis Date Noted  . Premature rupture of membranes in pregnancy, antepartum 08/12/2013  . First trimester bleeding 03/24/2013  . Supervision of low-risk pregnancy 03/24/2013  . Cervicitis 03/10/2013  . Vaginal bleeding 03/10/2013    PLAN: Continue inpatient monitoring for PPROM. Routine antenatal care.    Delivery with s/sx's of chorio or at 34 wks.  PRATT,TANYA S 08/14/2013,7:06 AM

## 2013-08-14 NOTE — Plan of Care (Signed)
Problem: Consults Goal: Birthing Suites Patient Information Press F2 to bring up selections list   Pt < [redacted] weeks EGA     

## 2013-08-15 DIAGNOSIS — Z3490 Encounter for supervision of normal pregnancy, unspecified, unspecified trimester: Secondary | ICD-10-CM

## 2013-08-15 LAB — CULTURE, BETA STREP (GROUP B ONLY)

## 2013-08-15 MED ORDER — FLEET ENEMA 7-19 GM/118ML RE ENEM: 1.0000 | ENEMA | RECTAL | Status: DC | PRN

## 2013-08-15 MED ORDER — DIPHENHYDRAMINE HCL 50 MG/ML IJ SOLN: 12.5000 mg | INTRAMUSCULAR | Status: DC | PRN

## 2013-08-15 MED ORDER — EPHEDRINE 5 MG/ML INJ
10.0000 mg | INTRAVENOUS | Status: DC | PRN
  Filled 2013-08-15: qty 2

## 2013-08-15 MED ORDER — LACTATED RINGERS IV SOLN: 500.0000 mL | INTRAVENOUS | Status: DC | PRN

## 2013-08-15 MED ORDER — EPHEDRINE 5 MG/ML INJ
10.0000 mg | INTRAVENOUS | Status: DC | PRN
  Filled 2013-08-15: qty 4
  Filled 2013-08-15: qty 2

## 2013-08-15 MED ORDER — ACETAMINOPHEN 325 MG PO TABS: 650.0000 mg | ORAL_TABLET | ORAL | Status: DC | PRN

## 2013-08-15 MED ORDER — CITRIC ACID-SODIUM CITRATE 334-500 MG/5ML PO SOLN: 30.0000 mL | ORAL | Status: DC | PRN

## 2013-08-15 MED ORDER — ONDANSETRON HCL 4 MG/2ML IJ SOLN: 4.0000 mg | Freq: Four times a day (QID) | INTRAMUSCULAR | Status: DC | PRN

## 2013-08-15 MED ORDER — FENTANYL CITRATE 0.05 MG/ML IJ SOLN
INTRAMUSCULAR | Status: AC
  Filled 2013-08-15: qty 2

## 2013-08-15 MED ORDER — FENTANYL CITRATE 0.05 MG/ML IJ SOLN
100.0000 ug | INTRAMUSCULAR | Status: DC | PRN
  Administered 2013-08-15: 100 ug via INTRAVENOUS

## 2013-08-15 MED ORDER — LACTATED RINGERS IV SOLN
INTRAVENOUS | Status: DC
  Administered 2013-08-16: 02:00:00 via INTRAVENOUS

## 2013-08-15 MED ORDER — OXYTOCIN BOLUS FROM INFUSION
500.0000 mL | INTRAVENOUS | Status: DC
  Administered 2013-08-16: 500 mL via INTRAVENOUS

## 2013-08-15 MED ORDER — LIDOCAINE HCL (PF) 1 % IJ SOLN
30.0000 mL | INTRAMUSCULAR | Status: DC | PRN
  Filled 2013-08-15 (×2): qty 30

## 2013-08-15 MED ORDER — SODIUM CHLORIDE 0.9 % IJ SOLN
3.0000 mL | Freq: Two times a day (BID) | INTRAMUSCULAR | Status: DC
  Administered 2013-08-15: 3 mL via INTRAVENOUS

## 2013-08-15 MED ORDER — OXYCODONE-ACETAMINOPHEN 5-325 MG PO TABS: 1.0000 | ORAL_TABLET | ORAL | Status: DC | PRN

## 2013-08-15 MED ORDER — LACTATED RINGERS IV SOLN
500.0000 mL | Freq: Once | INTRAVENOUS | Status: AC
  Administered 2013-08-16: 500 mL via INTRAVENOUS

## 2013-08-15 MED ORDER — FENTANYL 2.5 MCG/ML BUPIVACAINE 1/10 % EPIDURAL INFUSION (WH - ANES)
14.0000 mL/h | INTRAMUSCULAR | Status: DC | PRN
  Filled 2013-08-15: qty 125

## 2013-08-15 MED ORDER — PHENYLEPHRINE 40 MCG/ML (10ML) SYRINGE FOR IV PUSH (FOR BLOOD PRESSURE SUPPORT)
80.0000 ug | PREFILLED_SYRINGE | INTRAVENOUS | Status: DC | PRN
  Filled 2013-08-15: qty 10
  Filled 2013-08-15: qty 2

## 2013-08-15 MED ORDER — OXYTOCIN 40 UNITS IN LACTATED RINGERS INFUSION - SIMPLE MED
62.5000 mL/h | INTRAVENOUS | Status: DC
  Filled 2013-08-15: qty 1000

## 2013-08-15 MED ORDER — PHENYLEPHRINE 40 MCG/ML (10ML) SYRINGE FOR IV PUSH (FOR BLOOD PRESSURE SUPPORT)
80.0000 ug | PREFILLED_SYRINGE | INTRAVENOUS | Status: DC | PRN
  Filled 2013-08-15: qty 2

## 2013-08-15 MED ORDER — IBUPROFEN 600 MG PO TABS: 600.0000 mg | ORAL_TABLET | Freq: Four times a day (QID) | ORAL | Status: DC | PRN

## 2013-08-15 NOTE — Progress Notes (Signed)
NICU charge nurse notified patient transferred to L&D. Sophia Decker

## 2013-08-15 NOTE — Progress Notes (Signed)
Patient ID: Sophia Decker, female   DOB: 1991/06/07, 22 y.o.   MRN: [id number] FACULTY PRACTICE ANTEPARTUM(COMPREHENSIVE) NOTE  Sophia Decker is a 22 y.o. G2P0010 at [redacted]w[redacted]d by best clinical estimate who is admitted for PROM.   Fetal presentation is cephalic. Length of Stay:  3  Days  Subjective: Occasional mild contraction Patient reports the fetal movement as active. Patient reports uterine contraction  activity as mild, occasional. Patient reports  vaginal bleeding as none. Patient describes fluid per vagina as Clear.  Vitals:  Blood pressure 112/54, pulse 84, temperature 98.7 F (37.1 C), temperature source Oral, resp. rate 18, height 5\' 8"  (1.727 m), weight 180 lb 9.6 oz (81.92 kg), last menstrual period 01/06/2013, SpO2 98.00%. Physical Examination:  General appearance - alert, well appearing, and in no distress Heart - normal rate and regular rhythm Abdomen - soft, nontender, nondistended Fundal Height:  size equals dates Cervical Exam: Not evaluated. Extremities: extremities normal, atraumatic, no sign of DVT Membranes:ruptured, clear fluid  Fetal Monitoring:  Baseline: 145 bpm, Variability: Good {> 6 bpm), Accelerations: Reactive and Decelerations: Variable: mild  Labs:  No results found for this or any previous visit (from the past 24 hour(s)).  Imaging Studies:      Currently EPIC will not allow sonographic studies to automatically populate into notes.  In the meantime, copy and paste results into note or free text.  Medications:  Scheduled . amoxicillin  500 mg Oral Q8H  . docusate sodium  100 mg Oral Daily  . erythromycin  250 mg Oral Q6H  . prenatal multivitamin  1 tablet Oral Q1200   I have reviewed the patient's current medications.  ASSESSMENT: Patient Active Problem List   Diagnosis Date Noted  . Premature rupture of membranes in pregnancy, antepartum 08/12/2013  . First trimester bleeding 03/24/2013  . Supervision of low-risk pregnancy 03/24/2013  .  Cervicitis 03/10/2013  . Vaginal bleeding 03/10/2013    PLAN: Continue observation, deliver at 34 weeks or for s/sx chorioamnionitis or fetal indication  Collier Bohnet 08/15/2013,6:23 AM

## 2013-08-15 NOTE — Progress Notes (Signed)
Patient ID: Sophia Decker, female   DOB: [birth year], 23 y.o.   MRN: [id number] S: called to evaluate pt for contractions. Started around 6 pm but have gotten stronger over the last 2 hours. Happening every 3-8min and 6/10 in pain. Having some bloody show.  Continued LOF.  +FM.   O: Abd: ctx moderate to palpation SVE: 3/60/-3. VTX  FHT: 150, mod var, +accels, variable decel.   A/P  22 yo G2P0010 @ [redacted]w[redacted]d now day 5 after PPROM who is now in early labor.  - transfer to L&D - routine orders - epidural if cervix continues to change - fentanyl for pain currently  - NICU made aware of change in status.  - anticipate SVD - FWB - cat II tracing.   Suzetta Timko, [name], MD

## 2013-08-15 NOTE — Progress Notes (Signed)
UR completed 

## 2013-08-16 ENCOUNTER — Inpatient Hospital Stay (HOSPITAL_COMMUNITY): Payer: Medicaid Other | Admitting: Anesthesiology

## 2013-08-16 ENCOUNTER — Encounter (HOSPITAL_COMMUNITY): Payer: Self-pay | Admitting: Anesthesiology

## 2013-08-16 DIAGNOSIS — O4290 Premature rupture of membranes, unspecified as to length of time between rupture and onset of labor, unspecified weeks of gestation: Secondary | ICD-10-CM

## 2013-08-16 LAB — CBC
HCT: 36.5 % (ref 36.0–46.0)
Hemoglobin: 12.7 g/dL (ref 12.0–15.0)
MCH: 32.2 pg (ref 26.0–34.0)
MCV: 92.4 fL (ref 78.0–100.0)
RBC: 3.95 MIL/uL (ref 3.87–5.11)
RDW: 12.5 % (ref 11.5–15.5)

## 2013-08-16 LAB — RPR: RPR Ser Ql: NONREACTIVE

## 2013-08-16 MED ORDER — TETANUS-DIPHTH-ACELL PERTUSSIS 5-2.5-18.5 LF-MCG/0.5 IM SUSP
0.5000 mL | Freq: Once | INTRAMUSCULAR | Status: AC
  Administered 2013-08-17: 0.5 mL via INTRAMUSCULAR

## 2013-08-16 MED ORDER — OXYCODONE-ACETAMINOPHEN 5-325 MG PO TABS: 1.0000 | ORAL_TABLET | ORAL | Status: DC | PRN

## 2013-08-16 MED ORDER — IBUPROFEN 600 MG PO TABS
600.0000 mg | ORAL_TABLET | Freq: Four times a day (QID) | ORAL | Status: DC
  Administered 2013-08-16 – 2013-08-17 (×4): 600 mg via ORAL
  Filled 2013-08-16 (×4): qty 1

## 2013-08-16 MED ORDER — WITCH HAZEL-GLYCERIN EX PADS: 1.0000 "application " | MEDICATED_PAD | CUTANEOUS | Status: DC | PRN

## 2013-08-16 MED ORDER — DIBUCAINE 1 % RE OINT: 1.0000 "application " | TOPICAL_OINTMENT | RECTAL | Status: DC | PRN

## 2013-08-16 MED ORDER — FENTANYL 2.5 MCG/ML BUPIVACAINE 1/10 % EPIDURAL INFUSION (WH - ANES)
INTRAMUSCULAR | Status: DC | PRN
  Administered 2013-08-16: 14 mL/h via EPIDURAL

## 2013-08-16 MED ORDER — PRENATAL MULTIVITAMIN CH
1.0000 | ORAL_TABLET | Freq: Every day | ORAL | Status: DC
  Administered 2013-08-17: 1 via ORAL
  Filled 2013-08-16: qty 1

## 2013-08-16 MED ORDER — ONDANSETRON HCL 4 MG PO TABS: 4.0000 mg | ORAL_TABLET | ORAL | Status: DC | PRN

## 2013-08-16 MED ORDER — DIPHENHYDRAMINE HCL 25 MG PO CAPS: 25.0000 mg | ORAL_CAPSULE | Freq: Four times a day (QID) | ORAL | Status: DC | PRN

## 2013-08-16 MED ORDER — LIDOCAINE HCL (PF) 1 % IJ SOLN
INTRAMUSCULAR | Status: DC | PRN
  Administered 2013-08-16 (×2): 9 mL

## 2013-08-16 MED ORDER — SIMETHICONE 80 MG PO CHEW: 80.0000 mg | CHEWABLE_TABLET | ORAL | Status: DC | PRN

## 2013-08-16 MED ORDER — BENZOCAINE-MENTHOL 20-0.5 % EX AERO: 1.0000 "application " | INHALATION_SPRAY | CUTANEOUS | Status: DC | PRN

## 2013-08-16 MED ORDER — LANOLIN HYDROUS EX OINT: TOPICAL_OINTMENT | CUTANEOUS | Status: DC | PRN

## 2013-08-16 MED ORDER — ZOLPIDEM TARTRATE 5 MG PO TABS: 5.0000 mg | ORAL_TABLET | Freq: Every evening | ORAL | Status: DC | PRN

## 2013-08-16 MED ORDER — SENNOSIDES-DOCUSATE SODIUM 8.6-50 MG PO TABS
2.0000 | ORAL_TABLET | ORAL | Status: DC
  Administered 2013-08-16: 2 via ORAL
  Filled 2013-08-16: qty 2

## 2013-08-16 MED ORDER — ONDANSETRON HCL 4 MG/2ML IJ SOLN: 4.0000 mg | INTRAMUSCULAR | Status: DC | PRN

## 2013-08-16 NOTE — Anesthesia Postprocedure Evaluation (Signed)
Anesthesia Post Note  Patient: Sophia Decker  Procedure(s) Performed: * No procedures listed *  Anesthesia type: Epidural  Patient location: Mother/Baby  Post pain: Pain level controlled  Post assessment: Post-op Vital signs reviewed  Last Vitals: BP 108/67  Pulse 66  Temp(Src) 36.6 C (Oral)  Resp 18  Ht 5\' 8"  (1.727 m)  Wt 180 lb 9.6 oz (81.92 kg)  BMI 27.47 kg/m2  SpO2 100%  LMP 01/06/2013  Post vital signs: Reviewed  Level of consciousness: awake  Complications: No apparent anesthesia complications

## 2013-08-16 NOTE — Progress Notes (Signed)
Sophia Decker is a 22 y.o. G2P0010 at [redacted]w[redacted]d admitted for PPROM and now in early labor  Subjective: Contractions continuing and becoming stronger. +FM. Continued LOF.    Objective: BP 129/79  Pulse 93  Temp(Src) 98.8 F (37.1 C) (Oral)  Resp 20  Ht 5\' 8"  (1.727 m)  Wt 81.92 kg (180 lb 9.6 oz)  BMI 27.47 kg/m2  SpO2 98%  LMP 01/06/2013 I/O last 3 completed shifts: In: 3795 [P.O.:1320; I.V.:1425; IV Piggyback:1050] Out: 2150 [Urine:2150]    FHT:  FHR: 150 bpm, variability: moderate,  accelerations:  Present,  decelerations:  Absent UC:   regular, every 1-2 minutes SVE:   Dilation: 4 Effacement (%): 60 Station: -2 Exam by:: Erasmo Downer RN  Labs: Lab Results  Component Value Date   WBC 10.3 08/13/2013   HGB 12.3 08/13/2013   HCT 34.9* 08/13/2013   MCV 89.7 08/13/2013   PLT 195 08/13/2013    Assessment / Plan: PPROM and spontaneous laboe  Labor: progressing without augmentation Fetal Wellbeing:  Category I Pain Control:  Fentanyl and and now going to get an epidural I/D:  n/a Anticipated MOD:  NSVD  Sophia Decker L 08/16/2013, 1:04 AM

## 2013-08-16 NOTE — Anesthesia Preprocedure Evaluation (Signed)
Anesthesia Evaluation  Patient identified by MRN, date of birth, ID band Patient awake    Reviewed: Allergy & Precautions, H&P , NPO status , Patient's Chart, lab work & pertinent test results  Airway Mallampati: I TM Distance: >3 FB Neck ROM: full    Dental no notable dental hx.    Pulmonary neg pulmonary ROS,    Pulmonary exam normal       Cardiovascular negative cardio ROS      Neuro/Psych negative neurological ROS  negative psych ROS   GI/Hepatic negative GI ROS, Neg liver ROS,   Endo/Other  negative endocrine ROS  Renal/GU negative Renal ROS  negative genitourinary   Musculoskeletal negative musculoskeletal ROS (+)   Abdominal Normal abdominal exam  (+)   Peds  Hematology negative hematology ROS (+)   Anesthesia Other Findings   Reproductive/Obstetrics (+) Pregnancy                           Anesthesia Physical  Anesthesia Plan  ASA: II  Anesthesia Plan: Epidural   Post-op Pain Management:    Induction:   Airway Management Planned:   Additional Equipment:   Intra-op Plan:   Post-operative Plan:   Informed Consent: I have reviewed the patients History and Physical, chart, labs and discussed the procedure including the risks, benefits and alternatives for the proposed anesthesia with the patient or authorized representative who has indicated his/her understanding and acceptance.     Plan Discussed with:   Anesthesia Plan Comments:         Anesthesia Quick Evaluation  

## 2013-08-16 NOTE — Anesthesia Procedure Notes (Signed)
Epidural Patient location during procedure: OB Start time: 08/16/2013 1:58 AM End time: 08/16/2013 2:02 AM  Staffing Anesthesiologist: Leilani Able Performed by: anesthesiologist   Preanesthetic Checklist Completed: patient identified, site marked, surgical consent, pre-op evaluation, timeout performed, IV checked, risks and benefits discussed and monitors and equipment checked  Epidural Patient position: sitting Prep: site prepped and draped and DuraPrep Patient monitoring: continuous pulse ox and blood pressure Approach: midline Injection technique: LOR air  Needle:  Needle type: Tuohy  Needle gauge: 17 G Needle length: 9 cm and 9 Needle insertion depth: 5 cm cm Catheter type: closed end flexible Catheter size: 19 Gauge Catheter at skin depth: 10 cm Test dose: negative and Other  Assessment Sensory level: T9 Events: blood not aspirated, injection not painful, no injection resistance, negative IV test and no paresthesia

## 2013-08-16 NOTE — Lactation Note (Signed)
This note was copied from the chart of Sophia Decker. Lactation Consultation Note    Initial consult with this mom of a NICU baby, now 9 hours old,and 30 5/7 weeks corrected gestation.  Mom wants to provide EBM for her baby, so I showed her how to set premie setting, and instructed mom in pumping. She was also taught hand expression, and mom return demonstrated with good techniques. She expressed about 0.5 -1 mls. Mom is active with WIC, and will need a loaner DEP on discharge tomorrow.Mom knows to call WIC to add baby, and for DEP. Mom knows to ask her nurse to call lactation for qestions/concerns.  Patient Name: Sophia Decker [id number] Date: 08/16/2013 Reason for consult: Initial assessment;NICU baby;Infant < 6lbs   Maternal Data Formula Feeding for Exclusion: Yes (baby in NICU) Infant to breast within first hour of birth: No Breastfeeding delayed due to:: Infant status Has patient been taught Hand Expression?: Yes Does the patient have breastfeeding experience prior to this delivery?: No  Feeding    LATCH Score/Interventions                      Lactation Tools Discussed/Used Tools: Pump Breast pump type: Double-Electric Breast Pump WIC Program: Yes (momknows to call for EDP - WIC closed until 12/29) Pump Review: Setup, frequency, and cleaning;Milk Storage;Other (comment) (hand expression taught and teaching from the NICU booklet on providing EBM) Initiated by:: dbedside RN Date initiated:: 08/16/13   Consult Status Consult Status: Follow-up Date: 08/17/13 Follow-up type: In-patient    Alfred Levins 08/16/2013, 1:55 PM

## 2013-08-17 MED ORDER — IBUPROFEN 600 MG PO TABS: 600.0000 mg | ORAL_TABLET | Freq: Four times a day (QID) | ORAL | Status: DC

## 2013-08-17 NOTE — Progress Notes (Signed)
Pt is discharged in the care of husband. Downstairs per ambulatory with R.N. Daine Gip. Denis any pain or discomfort. Stable, Infant to remain in NICU. Discharge instructions were given with good understanding Questionswere asked and answered.

## 2013-08-17 NOTE — Discharge Summary (Signed)
Obstetric Discharge Summary Reason for Admission: PPROM @ 30weeks. Onset of PTL Prenatal Procedures: NST and ultrasound Intrapartum Procedures: spontaneous vaginal delivery Postpartum Procedures: none Complications-Operative and Postpartum: none Hemoglobin  Date Value Range Status  08/16/2013 12.7  12.0 - 15.0 g/dL Final     HCT  Date Value Range Status  08/16/2013 36.5  36.0 - 46.0 % Final    Physical Exam:  General: alert, cooperative and no distress Lochia: appropriate Uterine Fundus: firm Incision: N/A DVT Evaluation: No evidence of DVT seen on physical exam. No cords or calf tenderness. No significant calf/ankle edema.  Discharge Diagnoses: Premature labor and PPROM  Discharge Information: Date: 08/17/2013 Activity: pelvic rest Diet: routine Medications: PNV and Ibuprofen Condition: stable Instructions: refer to practice specific booklet Discharge to: home Follow-up Information   Follow up with [facility] In 6 weeks.   Contact information:   [address] [place] [address] [phone]      Newborn Data: Live born female  Birth Weight: 3 lb 3.5 oz (1460 g) APGAR: 8, 9  Home with mother.  22 yo G2P0111 who presented for PPROM at 30 weeks. She was given BMZ x2, latency abx, CP mag, and monitored. 5 days post admission, she went into PTL and progressed to deliver a liveborn female infant at [redacted]w[redacted]d. Baby went to NICU and pt had an uncomplicated postpartum course. She is breast and bottle feeding.  She is unsure about what she wants for birth control.  She will f/u in 6 weeks for postpartum visit.    Duy Lemming L 08/17/2013, 7:39 AM

## 2013-08-22 NOTE — Discharge Summary (Signed)
Attestation of Attending Supervision of Obstetric Fellow: Evaluation and management procedures were performed by the Obstetric Fellow under my supervision and collaboration.  I have reviewed the Obstetric Fellow's note and chart, and I agree with the management and plan.  UGONNA  ANYANWU, MD, FACOG Attending Obstetrician & Gynecologist Faculty Practice, [facility]   

## 2013-09-21 ENCOUNTER — Ambulatory Visit: Payer: Self-pay

## 2013-09-21 NOTE — Lactation Note (Signed)
This note was copied from the chart of Sophia Decker. Lactation Consultation Note     Follow up consult with this mom and baby, in the NICU. Sophia Decker is now 285 weeks old, and 35 6/7 weeks corrected gestation.  I assisted mom with latching in cross cradle hold, and Pablo transferred 24 mls in the first 30 minutes (pre and post weight done). I had mom latch him a second time, and she dd so independently this time. He  Transferred a additional 13 mls, making his total 37 mls. He was fed EBM by bottle pc. Mom was very pleased with how both she and Pablo did. i told her I would work with her again,and come up with a breast feeding plan prior to his discharge. Mom also aware she can come and see lactation in o/p, as needed.  Patient Name: Sophia Decker [id number]'s Date: 09/21/2013 Reason for consult: Follow-up assessment;NICU baby   Maternal Data    Feeding Feeding Type: Breast Fed Nipple Type: Slow - flow Length of feed: 30 min  LATCH Score/Interventions Latch: Grasps breast easily, tongue down, lips flanged, rhythmical sucking. (baby can latch a bit beyond mom's nipple, due to his small size) Intervention(s): Adjust position;Assist with latch;Breast massage;Breast compression  Audible Swallowing: A few with stimulation Intervention(s): Skin to skin;Hand expression  Type of Nipple: Everted at rest and after stimulation  Comfort (Breast/Nipple): Soft / non-tender     Hold (Positioning): Assistance needed to correctly position infant at breast and maintain latch. Intervention(s): Breastfeeding basics reviewed;Support Pillows;Position options;Skin to skin  LATCH Score: 8  Lactation Tools Discussed/Used     Consult Status Consult Status: PRN Follow-up type:  (in NICU)    Alfred LevinsLee, Jordi Lacko Anne 09/21/2013, 4:31 PM

## 2013-12-23 ENCOUNTER — Other Ambulatory Visit: Payer: Self-pay | Admitting: Nurse Practitioner

## 2013-12-23 DIAGNOSIS — N63 Unspecified lump in unspecified breast: Secondary | ICD-10-CM

## 2013-12-28 ENCOUNTER — Other Ambulatory Visit: Payer: Medicaid Other

## 2014-01-03 ENCOUNTER — Other Ambulatory Visit: Payer: Self-pay | Admitting: Nurse Practitioner

## 2014-01-03 ENCOUNTER — Ambulatory Visit
Admission: RE | Admit: 2014-01-03 | Discharge: 2014-01-03 | Disposition: A | Discharge status: Home / Self Care | Payer: No Typology Code available for payment source | Source: Ambulatory Visit | Attending: Nurse Practitioner | Admitting: Nurse Practitioner

## 2014-01-03 DIAGNOSIS — N63 Unspecified lump in unspecified breast: Secondary | ICD-10-CM

## 2014-06-26 ENCOUNTER — Encounter (HOSPITAL_COMMUNITY): Payer: Self-pay | Admitting: Anesthesiology

## 2014-07-11 ENCOUNTER — Other Ambulatory Visit (HOSPITAL_COMMUNITY): Payer: Self-pay | Admitting: *Deleted

## 2014-07-11 DIAGNOSIS — N6310 Unspecified lump in the right breast, unspecified quadrant: Secondary | ICD-10-CM

## 2014-08-10 ENCOUNTER — Ambulatory Visit (HOSPITAL_COMMUNITY)
Admission: RE | Admit: 2014-08-10 | Discharge: 2014-08-10 | Disposition: A | Discharge status: Home / Self Care | Payer: Self-pay | Source: Ambulatory Visit | Attending: Obstetrics and Gynecology | Admitting: Obstetrics and Gynecology

## 2014-08-10 ENCOUNTER — Ambulatory Visit
Admission: RE | Admit: 2014-08-10 | Discharge: 2014-08-10 | Disposition: A | Discharge status: Home / Self Care | Payer: No Typology Code available for payment source | Source: Ambulatory Visit | Attending: Obstetrics and Gynecology | Admitting: Obstetrics and Gynecology

## 2014-08-10 ENCOUNTER — Encounter (HOSPITAL_COMMUNITY): Payer: Self-pay

## 2014-08-10 VITALS — BP 128/76 | Temp 98.0°F | Ht 72.0 in | Wt 160.2 lb

## 2014-08-10 DIAGNOSIS — N6310 Unspecified lump in the right breast, unspecified quadrant: Secondary | ICD-10-CM

## 2014-08-10 DIAGNOSIS — Z1239 Encounter for other screening for malignant neoplasm of breast: Secondary | ICD-10-CM

## 2014-08-10 NOTE — Patient Instructions (Addendum)
Explained to Sophia Decker that she did not need a Pap smear today due to last Pap smear was 03/24/2013. Let her know B[facility]CP will cover Pap smears every 3 years unless has a history of abnormal Pap smears. [facility]. Appointment scheduled for Thursday, August 10, 2014 at 1500. Patient aware of appointment and will be there. Sophia Decker verbalized understanding.  Rakwon Letourneau, Kathaleen Maserhristine Poll, RN 1:32 PM

## 2014-08-10 NOTE — Progress Notes (Signed)
Complaints of right breast lump and pain. Patient states pain comes and goes. Patient rates pain at a 6 out of 10. [facility] recommended 6475-month follow-up right breast ultrasound. [facility] right breast [facility].   Pap Smear: Pap smear not completed today. Last [facility] and normal. Per patient has no history of an abnormal Pap smear. Last Pap smear result is in EPIC.  Physical exam: Breasts Breasts symmetrical. No skin abnormalities bilateral breasts. No nipple retraction bilateral breasts. No nipple discharge bilateral breasts. No lymphadenopathy. No lumps palpated left breast. Palpated a lump within the right breast at 1:30 o'clock 3 cm from the nipple. No complaints of pain or tenderness on exam. [facility] breast [facility]. Appointment scheduled for Thursday, August 10, 2014 at 1500.  Pelvic/Bimanual No Pap smear completed today since last Pap smear was 03/24/2013. Pap smear not indicated per BCCCP guidelines.

## 2015-02-21 ENCOUNTER — Other Ambulatory Visit: Payer: Self-pay | Admitting: Obstetrics and Gynecology

## 2015-02-21 DIAGNOSIS — Z803 Family history of malignant neoplasm of breast: Secondary | ICD-10-CM

## 2015-02-21 DIAGNOSIS — N6310 Unspecified lump in the right breast, unspecified quadrant: Secondary | ICD-10-CM

## 2015-02-28 ENCOUNTER — Ambulatory Visit
Admission: RE | Admit: 2015-02-28 | Discharge: 2015-02-28 | Disposition: A | Discharge status: Home / Self Care | Payer: No Typology Code available for payment source | Source: Ambulatory Visit | Attending: Obstetrics and Gynecology | Admitting: Obstetrics and Gynecology

## 2015-02-28 DIAGNOSIS — Z803 Family history of malignant neoplasm of breast: Secondary | ICD-10-CM

## 2015-02-28 DIAGNOSIS — N6310 Unspecified lump in the right breast, unspecified quadrant: Secondary | ICD-10-CM

## 2016-06-17 ENCOUNTER — Other Ambulatory Visit (HOSPITAL_COMMUNITY): Payer: Self-pay | Admitting: *Deleted

## 2016-06-17 DIAGNOSIS — N6310 Unspecified lump in the right breast, unspecified quadrant: Secondary | ICD-10-CM

## 2016-06-18 ENCOUNTER — Other Ambulatory Visit (HOSPITAL_COMMUNITY): Payer: Self-pay | Admitting: Obstetrics and Gynecology

## 2016-06-20 ENCOUNTER — Other Ambulatory Visit (HOSPITAL_COMMUNITY): Payer: Self-pay | Admitting: *Deleted

## 2016-06-20 DIAGNOSIS — N6310 Unspecified lump in the right breast, unspecified quadrant: Secondary | ICD-10-CM

## 2016-07-02 ENCOUNTER — Encounter (HOSPITAL_COMMUNITY): Payer: Self-pay | Admitting: *Deleted

## 2016-07-03 ENCOUNTER — Ambulatory Visit
Admission: RE | Admit: 2016-07-03 | Discharge: 2016-07-03 | Disposition: A | Discharge status: Home / Self Care | Payer: No Typology Code available for payment source | Source: Ambulatory Visit | Attending: Obstetrics and Gynecology | Admitting: Obstetrics and Gynecology

## 2016-07-03 ENCOUNTER — Ambulatory Visit (HOSPITAL_COMMUNITY)
Admission: RE | Admit: 2016-07-03 | Discharge: 2016-07-03 | Disposition: A | Discharge status: Home / Self Care | Payer: Self-pay | Source: Ambulatory Visit | Attending: Obstetrics and Gynecology | Admitting: Obstetrics and Gynecology

## 2016-07-03 ENCOUNTER — Encounter (HOSPITAL_COMMUNITY): Payer: Self-pay

## 2016-07-03 VITALS — BP 114/68 | Temp 98.6°F | Ht 72.0 in | Wt 156.6 lb

## 2016-07-03 DIAGNOSIS — N6314 Unspecified lump in the right breast, lower inner quadrant: Secondary | ICD-10-CM

## 2016-07-03 DIAGNOSIS — Z1239 Encounter for other screening for malignant neoplasm of breast: Secondary | ICD-10-CM

## 2016-07-03 DIAGNOSIS — N6310 Unspecified lump in the right breast, unspecified quadrant: Secondary | ICD-10-CM

## 2016-07-03 NOTE — Patient Instructions (Signed)
Explained breast self awareness to Engelhard Corporationnny E Decker. Patient did not need a Pap smear today due to last Pap smear was in 2015 per patient. Let her know B[facility]CP will cover Pap smears every 3 years unless has a history of abnormal Pap smears. [facility] for a right breast ultrasound. Appointment scheduled for Thursday, July 03, 2016 at 1110. Marina GoodellAnny E Decker verbalized understanding.  Willey Due, Kathaleen Maserhristine Poll, RN 12:39 PM

## 2016-07-03 NOTE — Progress Notes (Signed)
Complaints of right breast lump x 4 months that is painful at times. Patient states the pain comes and goes. Patient rates the pain at a 5 out of 10.  Pap Smear:  Pap smear not completed today. [facility] and normal. Per patient has no history of an abnormal Pap smear. Last Pap smear result is not in EPIC. Previous Pap smear 03/24/2013 is normal and in EPIC.  Physical exam: Breasts Breasts symmetrical. No skin abnormalities bilateral breasts. No nipple retraction bilateral breasts. No nipple discharge bilateral breasts. No lymphadenopathy. No lumps palpated left breast. Palpated a pea sized mobile lump within the right breast at 5 o'clock 3.5 cm from the nipple. No complaints of pain or tenderness on exam. [facility] for a right breast ultrasound. Appointment scheduled for Thursday, July 03, 2016 at 1110.        Pelvic/Bimanual No Pap smear completed today since last Pap smear was in 2015 per patient. Pap smear not indicated per BCCCP guidelines.   Smoking History: Patient has never smoked.  Patient Navigation: Patient education provided. Access to services provided for patient through [facility]. Spanish interpreter provided.  Used Spanish interpreter [employer] Mor[employer] from [place].

## 2016-07-04 ENCOUNTER — Other Ambulatory Visit: Payer: Self-pay

## 2016-07-07 ENCOUNTER — Encounter (HOSPITAL_COMMUNITY): Payer: Self-pay | Admitting: *Deleted

## 2017-03-31 ENCOUNTER — Other Ambulatory Visit: Payer: Self-pay | Admitting: Obstetrics and Gynecology

## 2017-03-31 DIAGNOSIS — N6310 Unspecified lump in the right breast, unspecified quadrant: Secondary | ICD-10-CM

## 2017-04-14 ENCOUNTER — Ambulatory Visit (HOSPITAL_COMMUNITY)
Admission: RE | Admit: 2017-04-14 | Discharge: 2017-04-14 | Disposition: A | Discharge status: Home / Self Care | Payer: Self-pay | Source: Ambulatory Visit | Attending: Obstetrics and Gynecology | Admitting: Obstetrics and Gynecology

## 2017-04-14 ENCOUNTER — Ambulatory Visit
Admission: RE | Admit: 2017-04-14 | Discharge: 2017-04-14 | Disposition: A | Discharge status: Home / Self Care | Payer: No Typology Code available for payment source | Source: Ambulatory Visit | Attending: Obstetrics and Gynecology | Admitting: Obstetrics and Gynecology

## 2017-04-14 ENCOUNTER — Other Ambulatory Visit: Payer: Self-pay | Admitting: Obstetrics and Gynecology

## 2017-04-14 ENCOUNTER — Encounter (HOSPITAL_COMMUNITY): Payer: Self-pay

## 2017-04-14 VITALS — BP 129/75 | HR 68 | Temp 97.8°F | Ht 72.0 in | Wt 158.5 lb

## 2017-04-14 DIAGNOSIS — N898 Other specified noninflammatory disorders of vagina: Secondary | ICD-10-CM

## 2017-04-14 DIAGNOSIS — N644 Mastodynia: Secondary | ICD-10-CM

## 2017-04-14 DIAGNOSIS — N6310 Unspecified lump in the right breast, unspecified quadrant: Secondary | ICD-10-CM

## 2017-04-14 DIAGNOSIS — Z01419 Encounter for gynecological examination (general) (routine) without abnormal findings: Secondary | ICD-10-CM

## 2017-04-14 NOTE — Progress Notes (Signed)
Complaints of a new right breast lump x 3 weeks.  Pap Smear: Pap smear completed today. Last Pap smear was in 2015 at the [facility] and normal. Per patient has no history of an abnormal Pap smear. Last Pap smear result is not in EPIC. Previous Pap smear 03/24/2013 is normal and in EPIC.  Physical exam: Breasts Breasts symmetrical. No skin abnormalities bilateral breasts. No nipple retraction bilateral breasts. No nipple discharge bilateral breasts. No lymphadenopathy. No lumps palpated left breast. Palpated a pea sized mobile lump within the right breast at 5 o'clock 3.5 cm from the nipple. Unable to palpated a lump in patient's area of concern. Complaints of tenderness with palpated right breast above nipple between 11-12 o'clock. Referred patient to the [facility] for a right breast ultrasound. Appointment scheduled for Tuesday, April 14, 2017 at 1100.        Pelvic/Bimanual   Ext Genitalia No lesions, no swelling and no discharge observed on external genitalia.         Vagina Vagina pink and normal texture. No lesions and thick yellowish color discharge observed in vagina. Wet prep completed.        Cervix Cervix is present. Cervix pink and of normal texture. Thick yellowish colored discharge observed on cervical os.    Uterus Uterus is present and palpable. Uterus in normal position and normal size.        Adnexae Bilateral ovaries present and palpable. No tenderness on palpation.          Rectovaginal No rectal exam completed today since patient had no rectal complaints. No skin abnormalities observed on exam.    Smoking History: Patient has never smoked.  Patient Navigation: Patient education provided. Access to services provided for patient through [facility] program. Spanish interpreter provided.  Used Spanish interpreter [employer] from [place].

## 2017-04-14 NOTE — Patient Instructions (Signed)
Explained breast self awareness with Marina Goodell. Let patient know BCCCP will cover Pap smears every 3 years unless has a history of abnormal Pap smears. Referred patient to the [facility] for a right breast ultrasound. Appointment scheduled for Tuesday, April 14, 2017 at 1100. Let patient know will follow up with her within the next couple weeks with results of Pap smear and wet prep by phone. Marina Goodell verbalized understanding.  Miguelina Fore, Kathaleen Maser, RN 8:50 AM

## 2017-04-15 ENCOUNTER — Other Ambulatory Visit: Payer: Self-pay | Admitting: Obstetrics and Gynecology

## 2017-04-15 LAB — CYTOLOGY - PAP
BACTERIAL VAGINITIS: POSITIVE — AB
Candida vaginitis: POSITIVE — AB
DIAGNOSIS: NEGATIVE
Trichomonas: NEGATIVE

## 2017-04-15 MED ORDER — FLUCONAZOLE 150 MG PO TABS: 150.0000 mg | ORAL_TABLET | Freq: Once | ORAL | 0 refills | Status: AC

## 2017-04-15 MED ORDER — METRONIDAZOLE 500 MG PO TABS: 500.0000 mg | ORAL_TABLET | Freq: Two times a day (BID) | ORAL | 0 refills | Status: DC

## 2017-04-20 ENCOUNTER — Other Ambulatory Visit (HOSPITAL_COMMUNITY): Payer: Self-pay | Admitting: *Deleted

## 2017-04-20 DIAGNOSIS — B3731 Acute candidiasis of vulva and vagina: Secondary | ICD-10-CM

## 2017-04-20 DIAGNOSIS — B373 Candidiasis of vulva and vagina: Secondary | ICD-10-CM

## 2017-04-20 MED ORDER — FLUCONAZOLE 150 MG PO TABS: 150.0000 mg | ORAL_TABLET | Freq: Once | ORAL | 0 refills | Status: AC

## 2017-04-20 NOTE — Progress Notes (Signed)
Called patient with Spanish interpreter Sophia Decker to let her know that her Pap smear was normal but showed BV and yeast. Explained to patient that prescriptions have been sent to her pharmacy to treat both. Told patient to avoid alcohol while taking the Flagyl and explained both medications to patient. Patient verbalized understanding.

## 2017-10-15 ENCOUNTER — Other Ambulatory Visit: Payer: No Typology Code available for payment source

## 2017-11-03 ENCOUNTER — Ambulatory Visit: Payer: Self-pay | Admitting: Women's Health

## 2017-11-16 ENCOUNTER — Other Ambulatory Visit (HOSPITAL_COMMUNITY): Payer: Self-pay | Admitting: *Deleted

## 2017-11-16 DIAGNOSIS — N6310 Unspecified lump in the right breast, unspecified quadrant: Secondary | ICD-10-CM

## 2017-12-03 ENCOUNTER — Ambulatory Visit (HOSPITAL_COMMUNITY)
Admission: RE | Admit: 2017-12-03 | Discharge: 2017-12-03 | Disposition: A | Discharge status: Home / Self Care | Payer: Self-pay | Source: Ambulatory Visit | Attending: Obstetrics and Gynecology | Admitting: Obstetrics and Gynecology

## 2017-12-03 ENCOUNTER — Encounter (HOSPITAL_COMMUNITY): Payer: Self-pay

## 2017-12-03 ENCOUNTER — Ambulatory Visit
Admission: RE | Admit: 2017-12-03 | Discharge: 2017-12-03 | Disposition: A | Discharge status: Home / Self Care | Payer: No Typology Code available for payment source | Source: Ambulatory Visit | Attending: Obstetrics and Gynecology | Admitting: Obstetrics and Gynecology

## 2017-12-03 ENCOUNTER — Other Ambulatory Visit (HOSPITAL_COMMUNITY): Payer: Self-pay | Admitting: Obstetrics and Gynecology

## 2017-12-03 ENCOUNTER — Other Ambulatory Visit: Payer: Self-pay | Admitting: Obstetrics and Gynecology

## 2017-12-03 VITALS — Ht 71.0 in | Wt 166.8 lb

## 2017-12-03 DIAGNOSIS — N6310 Unspecified lump in the right breast, unspecified quadrant: Secondary | ICD-10-CM

## 2017-12-03 DIAGNOSIS — Z1239 Encounter for other screening for malignant neoplasm of breast: Secondary | ICD-10-CM

## 2017-12-03 DIAGNOSIS — N6315 Unspecified lump in the right breast, overlapping quadrants: Secondary | ICD-10-CM

## 2017-12-03 NOTE — Progress Notes (Signed)
Complaints of right breast lump that is being followed by the Breast Center that has been painful at times. Patient states the pain comes and goes. Patient rates the pain at a 7 out of 10. Last right breast ultrasound was completed 04/14/2017 and 8927-month right breast follow-up recommended.   Pap Smear: Pap smear not completed today. [facility] and normal. Per patient has no history of an abnormal Pap smear. Last Pap smear result is in Epic.  Physical exam: Breasts Breasts symmetrical. No skin abnormalities bilateral breasts. No nipple retraction bilateral breasts. No nipple discharge bilateral breasts. No lymphadenopathy. No lumps palpated left breast. Palpated a lump within the right breast at 9 o'clock 5 cm from the nipple. Complaints of right inner lower quadrant breast tenderness on exam. [facility] for a right breast ultrasound. Appointment scheduled for Thursday, December 03, 2017 at 1500.        Pelvic/Bimanual No Pap smear completed today since last Pap smear was 04/14/2017. Pap smear not indicated per BCCCP guidelines.   Smoking History: Patient has never smoked.  Patient Navigation: Patient education provided. Access to services provided for [facility] through [facility]. Spanish interpreter provided.   Breast and Cervical Cancer Risk Assessment: Patient has no family history of her mother having breast cancer. Patient has no known genetic mutations or history of radiation treatment to the chest before age 8930. Patient has no history of cervical dysplasia, immunocompromised, or DES exposure in-utero.  Used Spanish interpreter [employer] McReynolds from [place].

## 2017-12-03 NOTE — Patient Instructions (Signed)
Explained breast self awareness with Marina GoodellAnny E Jimenez. Patient did not need a Pap smear today due to last Pap smear was 04/14/2017. Let her know BCCCP will cover Pap smears every 3 years unless has a history of abnormal Pap smears. [facility] for a right breast ultrasound. Appointment scheduled for Thursday, December 03, 2017 at 1500. Marina GoodellAnny E Jimenez verbalized understanding.  [place], Kathaleen Maserhristine Poll, RN 3:24 PM

## 2017-12-07 ENCOUNTER — Encounter: Payer: Self-pay | Admitting: Physician Assistant

## 2017-12-07 ENCOUNTER — Other Ambulatory Visit: Payer: Self-pay

## 2017-12-07 ENCOUNTER — Ambulatory Visit: Payer: BLUE CROSS/BLUE SHIELD | Admitting: Physician Assistant

## 2017-12-07 VITALS — BP 125/81 | HR 65 | Temp 98.7°F | Resp 16 | Ht 68.0 in | Wt 168.2 lb

## 2017-12-07 DIAGNOSIS — Z7189 Other specified counseling: Secondary | ICD-10-CM

## 2017-12-07 DIAGNOSIS — Z23 Encounter for immunization: Secondary | ICD-10-CM

## 2017-12-07 DIAGNOSIS — Z7185 Encounter for immunization safety counseling: Secondary | ICD-10-CM

## 2017-12-07 NOTE — Progress Notes (Addendum)
   CELA NEWCOM  MRN: [id number] DOB: July 18, 1991  PCP: Patient, No Pcp Per  Subjective:  Pt is a 27 year old female who presents to clinic for immunization of MMR. She is [place] She has lived here for 9 years.    She is in the process of submitting immigration papers and needs to be up to date on immunizations.  [facility] lab work showed nonimmunity for MMR.  She is feeling well today.   Care gaps: She plans to get PAP at [place] later this month.   Review of Systems  Constitutional: Negative for chills, diaphoresis and fever.  Cardiovascular: Negative for chest pain and palpitations.  Gastrointestinal: Negative for abdominal pain, nausea and vomiting.    Patient Active Problem List   Diagnosis Date Noted  . Pregnancy 08/15/2013  . Premature rupture of membranes in pregnancy, antepartum 08/12/2013  . First trimester bleeding 03/24/2013  . Supervision of low-risk pregnancy 03/24/2013  . Cervicitis 03/10/2013  . Vaginal bleeding 03/10/2013    Current Outpatient Medications on File Prior to Visit  Medication Sig Dispense Refill  . Prenatal Vit-Fe Fumarate-FA (PRENATAL MULTIVITAMIN) TABS Take 1 tablet by mouth daily at 12 noon. Gummy pre-natal vitamin     No current facility-administered medications on file prior to visit.     No Known Allergies   Objective:  BP 125/81   Pulse 65   Temp 98.7 F (37.1 C) (Oral)   Resp 16   Ht '5\' 8"'$  (1.727 m)   Wt 168 lb 3.2 oz (76.3 kg)   LMP 11/19/2017 (Approximate)   SpO2 100%   BMI 25.57 kg/m   Physical Exam  Constitutional: She is oriented to person, place, and time. No distress.  Cardiovascular: Normal rate, regular rhythm and normal heart sounds.  Neurological: She is alert and oriented to person, place, and time.  Skin: Skin is warm and dry.  Psychiatric: Judgment normal.  Vitals reviewed.   Assessment and Plan :  1. Immunization counseling 2. Need for MMR vaccine - MMR vaccine subcutaneous -  pt presents for update immunizations. She is from the [place] and is updating her paper work. Recent titers showed non immunity for MMR. MMR vaccine given by CMA. Side effect profile provided for pt.   Mercer Pod, PA-C  [facility] 12/07/2017 5:37 PM

## 2017-12-07 NOTE — Patient Instructions (Addendum)
If you do not get routine blood tests at your gynecology appointment, call and schedule an annual exam with me and we will get it done.   Thank you for coming in today. I hope you feel we met your needs. Feel free to call PCP if you have any questions or further requests. Please consider signing up for MyChart if you do not already have it, as this is a great way to communicate with me.  Best,  Mercer Pod, PA-C  Measles/Mumps/Rubella Vaccines, MMR injection What is this medicine? MEASLES VIRUS; MUMPS VIRUS; RUBELLA VIRUS VACCINE LIVE (MEE zuhlz VAHY ruhs; muhmps VAHY ruhs; roo bel uh VAHY ruhs vak SEEN [employer] [place] ) is used to prevent an infection with measles (rubeola), mumps, and rubella ([place] measles) viruses. It is used to prevent infection in children over 48 months old, adults that have not been vaccinated and are not pregnant, and anyone traveling to countries where there are high rates of measles, mumps, or rubella. This medicine may be used for other purposes; ask your health care provider or pharmacist if you have questions. COMMON BRAND NAME(S): M-M-R II What should I tell my health care provider before I take this medicine? They need to know if you have any of these conditions: -bleeding disorder -cancer including leukemia or lymphoma -immune system problems -infection with fever -low levels of platelets in the blood -recent blood transfusion or immune globulin infusion -seizure disorder -taking medicines for immunosuppression -an unusual or allergic reaction to vaccines, eggs, neomycin, gelatin, other medicines, foods, dyes, or preservatives -pregnant or trying to get pregnant -breast-feeding How should I use this medicine? This vaccine is for injection under the skin. It is given by a health care professional. A copy of Vaccine Information Statements will be given before each vaccination. Read this sheet carefully each time. The sheet may change frequently. Talk to your  pediatrician regarding the use of this medicine in children. While this drug may be prescribed for children as young as 13 months of age for selected conditions, precautions do apply. Overdosage: If you think you have taken too much of this medicine contact a poison control center or emergency room at once. NOTE: This medicine is only for you. Do not share this medicine with others. What if I miss a dose? Keep appointments for follow-up (booster) doses as directed. It is important not to miss your dose. Call your doctor or health care professional if you are unable to keep an appointment. What may interact with this medicine? Do not take this medicine with any of the following medications: -adalimumab -anakinra -etanercept -infliximab -medicines that suppress your immune system -medicines to treat cancer This medicine may also interact with the following medications: -immune globulins -live virus vaccines This list may not describe all possible interactions. Give your health care provider a list of all the medicines, herbs, non-prescription drugs, or dietary supplements you use. Also tell them if you smoke, drink alcohol, or use illegal drugs. Some items may interact with your medicine. What should I watch for while using this medicine? Visit your doctor for check-ups as directed. Do not become pregnant for 3 months after receiving this vaccine. Women should inform their doctor if they wish to become pregnant or think they might be pregnant. There is a potential for serious side effects to an unborn child. Talk to your health care professional or pharmacist for more information. What side effects may I notice from receiving this medicine? Side effects that you should report to  your doctor or health care professional as soon as possible: -allergic reactions like skin rash, itching or hives, swelling of the face, lips, or tongue -breathing problems -changes in hearing -changes in  vision -difficulty walking -extreme changes in behavior -fast, irregular heartbeat -fever over 100 degrees F -pain, tingling, numbness in the hands or feet -seizures -unusual bleeding or bruising -unusually weak or tired Side effects that usually do not require medical attention (report to your doctor or health care professional if they continue or are bothersome): -aches or pains -bruising, pain, swelling at site where injected -diarrhea -headache -low-grade fever of 100 degrees F or less -nausea, vomiting -runny nose, cough -sleepy -swollen glands This list may not describe all possible side effects. Call your doctor for medical advice about side effects. You may report side effects to FDA at 1-800-FDA-1088. Where should I keep my medicine? This drug is given in a hospital or clinic and will not be stored at home. NOTE: This sheet is a summary. It may not cover all possible information. If you have questions about this medicine, talk to your doctor, pharmacist, or health care provider.  2018 Elsevier/Gold Standard (2013-09-09 11:04:43)   IF you received an x-ray today, you will receive an invoice from [facility]. Please contact [facility] at [phone] with questions or concerns regarding your invoice.   IF you received labwork today, you will receive an invoice from [place]. Please contact [facility] at [phone] with questions or concerns regarding your invoice.   Our billing staff will not be able to assist you with questions regarding bills from these companies.  You will be contacted with the lab results as soon as they are available. The fastest way to get your results is to activate your My Chart account. Instructions are located on the last page of this paperwork. If you have not heard from [place] regarding the results in 2 weeks, please contact this office.

## 2017-12-14 ENCOUNTER — Encounter (HOSPITAL_COMMUNITY): Payer: Self-pay | Admitting: *Deleted

## 2017-12-16 ENCOUNTER — Ambulatory Visit (INDEPENDENT_AMBULATORY_CARE_PROVIDER_SITE_OTHER): Payer: BLUE CROSS/BLUE SHIELD | Admitting: Obstetrics & Gynecology

## 2017-12-16 ENCOUNTER — Encounter: Payer: Self-pay | Admitting: Obstetrics & Gynecology

## 2017-12-16 VITALS — BP 126/78 | Ht 68.0 in | Wt 167.0 lb

## 2017-12-16 DIAGNOSIS — R102 Pelvic and perineal pain unspecified side: Secondary | ICD-10-CM

## 2017-12-16 DIAGNOSIS — N898 Other specified noninflammatory disorders of vagina: Secondary | ICD-10-CM

## 2017-12-16 DIAGNOSIS — Z3046 Encounter for surveillance of implantable subdermal contraceptive: Secondary | ICD-10-CM

## 2017-12-16 DIAGNOSIS — Z01411 Encounter for gynecological examination (general) (routine) with abnormal findings: Secondary | ICD-10-CM | POA: Diagnosis not present

## 2017-12-16 DIAGNOSIS — Z803 Family history of malignant neoplasm of breast: Secondary | ICD-10-CM | POA: Diagnosis not present

## 2017-12-16 LAB — WET PREP FOR TRICH, YEAST, CLUE

## 2017-12-16 MED ORDER — TINIDAZOLE 500 MG PO TABS: 2.0000 g | ORAL_TABLET | Freq: Every day | ORAL | 0 refills | Status: AC

## 2017-12-16 MED ORDER — FLUCONAZOLE 150 MG PO TABS: 150.0000 mg | ORAL_TABLET | Freq: Every day | ORAL | 1 refills | Status: AC

## 2017-12-16 NOTE — Progress Notes (Signed)
Sophia Decker [birth year] [id number]   History:    27 y.o. G2P1A1L1 Married (7 months).  Son 42 yo.  RP:  New patient presenting for annual gyn exam   HPI: On Nexplanon x 1 year.  No hormonal side effect, but frequent breakthrough bleeding.  Still wants to continue with Nexplanon.  No frank pelvic pain, but low abdominal discomfort intermittently.  Not necessarily associated with the breakthrough bleeding.  Complains of vaginal itching and discharge currently.  Some discomfort with intercourse.  Urine and bowel movements normal.  Breasts normal.  Never had a mammogram, but doing serial right breast ultrasound for the last 3 years because of small lumps on the right breast.  Last right breast ultrasound was December 03, 2017.  The results was probably benign, repeat in 6 months.  Mother with breast cancer at age 24, no genetic testing done.  Given that her mother does not live in the [place], patient would prefer to be the one doing genetic screening.  Will follow up here for fasting health labs.  Past medical history,surgical history, family history and social history were all reviewed and documented in the EPIC chart.  Gynecologic History Patient's last menstrual period was 11/19/2017. Contraception: Nexplanon Last Pap: 1.5 yr ago. Results were: normal Last mammogram: Never.  Followed with Rt breast US x 3 yrs, last 12/03/2017 benign, repeat in 6 months. Bone Density: Never Colonoscopy: Never  Obstetric History OB History  Gravida Para Term Preterm AB Living  2 1 0 1 1 1   SAB TAB Ectopic Multiple Live Births    1     1    # Outcome Date GA Lbr Len/2nd Weight Sex Delivery Anes PTL Lv  2 Preterm 08/16/13 [redacted]w[redacted]d/ 00:11 3 lb 3.5 oz (1.46 kg) M Vag-Spont EPI  LIV  1 TAB 2012 [id number]          [place]irth Comments: [place]   ROS: A ROS was performed and pertinent positives and negatives are included in the history.  GENERAL: No fevers or chills. HEENT: No change in vision, no earache,  sore throat or sinus congestion. NECK: No pain or stiffness. CARDIOVASCULAR: No chest pain or pressure. No palpitations. PULMONARY: No shortness of breath, cough or wheeze. GASTROINTESTINAL: No abdominal pain, nausea, vomiting or diarrhea, melena or bright red blood per rectum. GENITOURINARY: No urinary frequency, urgency, hesitancy or dysuria. MUSCULOSKELETAL: No joint or muscle pain, no back pain, no recent trauma. DERMATOLOGIC: No rash, no itching, no lesions. ENDOCRINE: No polyuria, polydipsia, no heat or cold intolerance. No recent change in weight. HEMATOLOGICAL: No anemia or easy bruising or bleeding. NEUROLOGIC: No headache, seizures, numbness, tingling or weakness. PSYCHIATRIC: No depression, no loss of interest in normal activity or change in sleep pattern.     Exam:   Ht 5' 8"  (1.727 m)   Wt 167 lb (75.8 kg)   LMP 11/19/2017   BMI 25.39 kg/m   Body mass index is 25.39 kg/m.  General appearance : Well developed well nourished female. No acute distress HEENT: Eyes: no retinal hemorrhage or exudates,  Neck supple, trachea midline, no carotid bruits, no thyroidmegaly Lungs: Clear to auscultation, no rhonchi or wheezes, or rib retractions  Heart: Regular rate and rhythm, no murmurs or gallops Breast:Examined in sitting and supine position were symmetrical in appearance, no palpable masses or tenderness,  no skin retraction, no nipple inversion, no nipple discharge, no skin discoloration, no axillary or supraclavicular lymphadenopathy Abdomen:  no palpable masses or tenderness, no rebound or guarding Extremities: no edema or skin discoloration or tenderness  Pelvic: Vulva: Normal             Vagina: No gross lesions or discharge  Cervix: No gross lesions or discharge.  Pap reflex done.  Uterus  AV, normal size, shape and consistency, non-tender and mobile  Adnexa  Without masses or tenderness  Anus: Normal   Assessment/Plan:  27 y.o. female for annual exam   1. Encounter for  gynecological examination with abnormal finding Gynecologic exam with pelvic tenderness and increased vaginal discharge.  Pap reflex done.  Recent gonorrhea and Chlamydia -2 weeks ago, patient will bring documentation of the results next visit.  Left breast exam normal.  Right breast exam with probably benign nodules.  Findings on exam concordant with the right breast ultrasound December 03, 2017 probably benign, recommend repeat right breast ultrasound in 6 months. - CBC; Future - Comp Met (CMET); Future - TSH; Future - Lipid panel; Future - VITAMIN D 25 Hydroxy (Vit-D Deficiency, Fractures); Future - Pap IG w/ reflex to HPV when ASC-U  2. Encounter for surveillance of implantable subdermal contraceptive Doing well on Nexplanon inserted a year ago.  Has breakthrough bleeding, but prefers to continue on Nexplanon.  Declines add back birth control pills.  3. Pelvic pain in female Normal gynecologic exam, but will further investigate with a pelvic ultrasound at follow-up. - US Transvaginal Non-OB; Future  4. Vaginal discharge Wet prep shows clue cells, compatible with a bacterial vaginosis.  Will treat with tinidazole.  Prescription sent to pharmacy and usage reviewed.  Patient will take fluconazole after the antibiotics to treat or prevent yeast vaginitis. - WET PREP FOR [place], YEAST, CLUE   5. Family history of breast cancer in mother Referred to genetic counselor for evaluation and testing.  Other orders - Multiple Vitamin (MULTIVITAMIN) tablet; Take 1 tablet by mouth daily. - tinidazole (TINDAMAX) 500 MG tablet; Take 4 tablets (2,000 mg total) by mouth daily for 2 days. - fluconazole (DIFLUCAN) 150 MG tablet; Take 1 tablet (150 mg total) by mouth daily for 3 days.  Counseling on above issues and coordination of care more than 50% for 20 minutes  Princess Bruins MD, 4:26 PM 12/16/2017

## 2017-12-17 ENCOUNTER — Encounter: Payer: Self-pay | Admitting: Obstetrics & Gynecology

## 2017-12-17 ENCOUNTER — Telehealth: Payer: Self-pay | Admitting: *Deleted

## 2017-12-17 DIAGNOSIS — Z803 Family history of malignant neoplasm of breast: Secondary | ICD-10-CM

## 2017-12-17 NOTE — Telephone Encounter (Signed)
-----   Message from Genia DelMarie-Lyne Lavoie, MD sent at 12/16/2017  4:54 PM EDT ----- Regarding: Refer for genetic counseling Mother with Breast Cancer at 29 yo.  Not tested.  Patient would like genetic counseling/testing.  Other cancers in family as well.

## 2017-12-17 NOTE — Patient Instructions (Signed)
1. Encounter for gynecological examination with abnormal finding Gynecologic exam with pelvic tenderness and increased vaginal discharge.  Pap reflex done.  Recent gonorrhea and Chlamydia -2 weeks ago, patient will bring documentation of the results next visit.  Left breast exam normal.  Right breast exam with probably benign nodules.  Findings on exam concordant with the right breast ultrasound December 03, 2017 probably benign, recommend repeat right breast ultrasound in 6 months. - CBC; Future - Comp Met (CMET); Future - TSH; Future - Lipid panel; Future - VITAMIN D 25 Hydroxy (Vit-D Deficiency, Fractures); Future - Pap IG w/ reflex to HPV when ASC-U  2. Encounter for surveillance of implantable subdermal contraceptive Doing well on Nexplanon inserted a year ago.  Has breakthrough bleeding, but prefers to continue on Nexplanon.  Declines add back birth control pills.  3. Pelvic pain in female Normal gynecologic exam, but will further investigate with a pelvic ultrasound at follow-up. - US Transvaginal Non-OB; Future  4. Vaginal discharge Wet prep shows clue cells, compatible with a bacterial vaginosis.  Will treat with tinidazole.  Prescription sent to pharmacy and usage reviewed.  Patient will take fluconazole after the antibiotics to treat or prevent yeast vaginitis. - WET PREP FOR [place], YEAST, CLUE   5. Family history of breast cancer in mother Referred to genetic counselor for evaluation and testing.  Other orders - Multiple Vitamin (MULTIVITAMIN) tablet; Take 1 tablet by mouth daily. - tinidazole (TINDAMAX) 500 MG tablet; Take 4 tablets (2,000 mg total) by mouth daily for 2 days. - fluconazole (DIFLUCAN) 150 MG tablet; Take 1 tablet (150 mg total) by mouth daily for 3 days.  [place], fue un placer encontrarle hoy!  Voy a informarle de sus resultados muy pronto y verle de nuevo cuando regresso para el [place].

## 2017-12-17 NOTE — Telephone Encounter (Signed)
Referral placed at [facility] they will call patient to schedule.

## 2017-12-18 ENCOUNTER — Encounter: Payer: Self-pay | Admitting: Genetic Counselor

## 2017-12-18 ENCOUNTER — Telehealth: Payer: Self-pay | Admitting: Genetic Counselor

## 2017-12-18 LAB — PAP IG W/ RFLX HPV ASCU

## 2017-12-18 NOTE — Telephone Encounter (Signed)
A genetic counseling appt has been scheduled for the pt to see Maylon CosKaren Powell on 6/17 at 2pm. Pt aware to arrive 30 minutes early. Letter mailed to the pt.

## 2017-12-18 NOTE — Telephone Encounter (Signed)
Pt scheduled on 02/08/18

## 2017-12-21 ENCOUNTER — Telehealth: Payer: Self-pay | Admitting: *Deleted

## 2017-12-21 MED ORDER — TINIDAZOLE 500 MG PO TABS: ORAL_TABLET | ORAL | 0 refills | Status: DC

## 2017-12-21 MED ORDER — FLUCONAZOLE 150 MG PO TABS: 150.0000 mg | ORAL_TABLET | Freq: Every day | ORAL | 1 refills | Status: DC

## 2017-12-21 NOTE — Telephone Encounter (Signed)
Patient was prescribed tinidazole (TINDAMAX) 500 MG tablet; Take 4 tablets (2,000 mg total) by mouth daily for 2 days. - fluconazole (DIFLUCAN) 150 MG tablet; Take 1 tablet (150 mg total) by mouth daily for 3 days. On 12/16/17, [facility] no longer pays for medication with her insurance. Pt never picked up Rx, she asked if I can send [facility] to [facility]. Rx sent.

## 2018-01-12 ENCOUNTER — Ambulatory Visit: Payer: BLUE CROSS/BLUE SHIELD | Admitting: Obstetrics & Gynecology

## 2018-01-12 ENCOUNTER — Other Ambulatory Visit: Payer: BLUE CROSS/BLUE SHIELD

## 2018-02-04 ENCOUNTER — Encounter: Payer: Self-pay | Admitting: Sports Medicine

## 2018-02-04 ENCOUNTER — Ambulatory Visit (INDEPENDENT_AMBULATORY_CARE_PROVIDER_SITE_OTHER): Payer: BLUE CROSS/BLUE SHIELD | Admitting: Sports Medicine

## 2018-02-04 VITALS — BP 132/80 | Ht 71.0 in | Wt 165.0 lb

## 2018-02-04 DIAGNOSIS — M222X1 Patellofemoral disorders, right knee: Secondary | ICD-10-CM | POA: Diagnosis not present

## 2018-02-04 DIAGNOSIS — M222X2 Patellofemoral disorders, left knee: Secondary | ICD-10-CM

## 2018-02-04 NOTE — Progress Notes (Signed)
   Subjective:    Patient ID: Sophia GoodellAnny E Decker, female    DOB: Mar 25, 1991, 27 y.o.   MRN: [id number]  HPI Sophia Decker is a 27 year old female with no significant past medical history that presents with bilateral knee pain.  She notes the pain has been occurring for approximately one year and has gradually worsened. There was no initial injury or trauma. Denies swelling, popping, locking, catching, or crepitus. Her pain is anterior and describes as an aching sensation. It is worse with squats, weight lifting, and running.  She has not tried any pain medication. The pain is improved with rest. Her knees do not feel unstable.   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 She has had no issues since that time and does not currently wear shoe inserts.   Past medical history reviewed Medications reviewed Allergies reviewed  Review of Systems As per HPI    Objective:   Physical Exam General: well-appearing female in no acute distress CV: warm and well-perfused Lungs: comfortable work of breathing MSK: No obvious deformity or swelling of bilateral knees. Valgus malalignment noted. Full range of motion. No pain with active resistance. 5/5 strength in bilateral lower extremities. Slight weakness of hip abductors. No joint line tenderness. Negative Thessaly. Negative J sign.       Assessment & Plan:  Sophia Decker is a 27 year old female with no significant past medical history that presents with chronic bilateral knee pain most consistent with patellofemoral syndrome. Low concern for other knee injury as she has no complaints of swelling, popping, locking, or catching, there was no initial trauma, and her exam had a negative Thessalys.   1. Patellofemoral syndrome of both knees -Recommended avoiding activities that exacerbate pain including squats and lunges -Demonstrated and provided strengthening exercises for VMO and hip  muscles.  -Return to clinic in 6 weeks for follow-up  Patient seen and evaluated with the resident. I agree with the above plan of care. Patient's history and physical exam consistent with patellofemoral pain syndrome. She will start a home exercise program and follow-up in 6 weeks. If symptoms persist then I'll consider the merits of imaging versus formal physical therapy.

## 2018-02-08 ENCOUNTER — Inpatient Hospital Stay: Payer: BLUE CROSS/BLUE SHIELD

## 2018-02-08 ENCOUNTER — Inpatient Hospital Stay: Payer: BLUE CROSS/BLUE SHIELD | Attending: Genetic Counselor | Admitting: Genetic Counselor

## 2018-02-08 ENCOUNTER — Encounter: Payer: Self-pay | Admitting: Genetic Counselor

## 2018-02-08 DIAGNOSIS — Z803 Family history of malignant neoplasm of breast: Secondary | ICD-10-CM | POA: Insufficient documentation

## 2018-02-08 DIAGNOSIS — Z8042 Family history of malignant neoplasm of prostate: Secondary | ICD-10-CM | POA: Diagnosis not present

## 2018-02-08 NOTE — Progress Notes (Signed)
REFERRING PROVIDER: Princess Bruins, [address] [place], [place] [address]  PRIMARY PROVIDER:  Patient, No Pcp Per  PRIMARY REASON FOR VISIT:  1. Family history of breast cancer   2. Family history of prostate cancer      HISTORY OF PRESENT ILLNESS:   [name], a 27 y.o. female, was seen for a [facility] cancer genetics consultation at the request of Dr. Dellis Filbert due to a family history of cancer.  [name] presents to clinic today to discuss the possibility of a hereditary predisposition to cancer, genetic testing, and to further clarify her future cancer risks, as well as potential cancer risks for family members.   [name] is a 27 y.o. female with no personal history of cancer.    CANCER HISTORY:   No history exists.     HORMONAL RISK FACTORS:  Menarche was at age 27.  First live birth at age 37.  OCP use for approximately 0 years.  Ovaries intact: yes.  Hysterectomy: no.  Menopausal status: premenopausal.  HRT use: 0 years. Colonoscopy: no; not examined. Mammogram within the last year: no. Number of breast biopsies: 0. Up to date with pelvic exams:  yes. Any excessive radiation exposure in the past:  no  Past Medical History:  Diagnosis Date  . Family history of breast cancer   . Family history of prostate cancer   . Medical history non-contributory     Past Surgical History:  Procedure Laterality Date  . NO PAST SURGERIES      Social History   Socioeconomic History  . Marital status: Single    Spouse name: Not on file  . Number of children: Not on file  . Years of education: Not on file  . Highest education level: Not on file  Occupational History  . Not on file  Social Needs  . Financial resource strain: Not on file  . Food insecurity:    Worry: Not on file    Inability: Not on file  . Transportation needs:    Medical: Not on file    Non-medical: Not on file  Tobacco Use  . Smoking status: Never Smoker  . Smokeless  tobacco: Never Used  Substance and Sexual Activity  . Alcohol use: Yes    Comment: occassionally  . Drug use: No  . Sexual activity: Yes    Partners: Male    Birth control/protection: None, Implant    Comment: 1st intercourse- 9, partners- 12, married- 7 months   Lifestyle  . Physical activity:    Days per week: 5 days    Minutes per session: 80 min  . Stress: Only a little  Relationships  . Social connections:    Talks on phone: More than three times a week    Gets together: Once a week    Attends religious service: Never    Active member of club or organization: No    Attends meetings of clubs or organizations: Never    Relationship status: Married  Other Topics Concern  . Not on file  Social History Narrative  . Not on file     FAMILY HISTORY:  We obtained a detailed, 4-generation family history.  Significant diagnoses are listed below: Family History  Problem Relation Age of Onset  . Cancer Father        liver  . Migraines Mother   . Breast cancer Mother 51  . Prostate cancer Maternal Uncle 34  . Cancer Paternal Grandmother  NOS    The patient has a son who is cancer free.  She has one brother who is cancer free.  Her father is deceased from liver cancer and her mother was diagnosed with breast cancer at age 8.  The patient's father died at 59.  He had two brothers and two sisters who are cancer free.  His mother died of an unknown cancer.  The patient's mother was diagnosed with breast cancer at age 29.  This was treated with chemotherapy, unilateral mastectomy, radiation and anti-estrogen pills.  She has one brother who was diagnosed with prostate cancer at age 63.  The maternal grandparents are both deceased from unknown causes.  [name] is unaware of previous family history of genetic testing for hereditary cancer risks. Patient's maternal ancestors are of [place] descent, and paternal ancestors are of [place] descent. There is  no reported Ashkenazi Jewish ancestry. There is no known consanguinity.  GENETIC COUNSELING ASSESSMENT: ITZABELLA SORRELS is a 27 y.o. female with a family history of breast and prostate cancer which is somewhat suggestive of a hereditary cancer syndrome and predisposition to cancer. We, therefore, discussed and recommended the following at today's visit.   DISCUSSION: We discussed that about 5-10% of breast cancer is hereditary with most cases due to BRCA mutations.  Other genes are associated with hereditary breast cancer syndromes including ATM, CHEK2 and PALB2.  We reviewed the characteristics, features and inheritance patterns of hereditary cancer syndromes. We also discussed genetic testing, including the appropriate family members to test, the process of testing, insurance coverage and turn-around-time for results. We discussed the implications of a negative, positive and/or variant of uncertain significant result. We recommended [name] pursue genetic testing for the Multi-cancer gene panel. The Multi-Gene Panel offered by [facility] includes sequencing and/or deletion duplication testing of the following 83 genes: ALK, APC, ATM, AXIN2,BAP1,  BARD1, BLM, BMPR1A, BRCA1, BRCA2, BRIP1, CASR, CDC73, CDH1, CDK4, CDKN1B, CDKN1C, CDKN2A (p14ARF), CDKN2A (p16INK4a), CEBPA, CHEK2, CTNNA1, DICER1, DIS3L2, EGFR (c.2369C>T, p.Thr790Met variant only), EPCAM (Deletion/duplication testing only), FH, FLCN, GATA2, GPC3, GREM1 (Promoter region deletion/duplication testing only), HOXB13 (c.251G>A, p.Gly84Glu), HRAS, KIT, MAX, MEN1, MET, MITF (c.952G>A, p.Glu318Lys variant only), MLH1, MSH2, MSH3, MSH6, MUTYH, NBN, NF1, NF2, NTHL1, PALB2, PDGFRA, PHOX2B, PMS2, POLD1, POLE, POT1, PRKAR1A, PTCH1, PTEN, RAD50, RAD51C, RAD51D, RB1, RECQL4, RET, RUNX1, SDHAF2, SDHA (sequence changes only), SDHB, SDHC, SDHD, SMAD4, SMARCA4, SMARCB1, SMARCE1, STK11, SUFU, TERT, TERT, TMEM127, TP53, TSC1, TSC2, VHL, WRN and WT1.    Based on Ms.  Christell Faith personal and family history of cancer, she meets medical criteria for genetic testing. Despite that she meets criteria, she may still have an out of pocket cost. We discussed that if her out of pocket cost for testing is over $100, the laboratory will call and confirm whether she wants to proceed with testing.  If the out of pocket cost of testing is less than $100 she will be billed by the genetic testing laboratory.   PLAN: After considering the risks, benefits, and limitations, [name]  provided informed consent to pursue genetic testing and the blood sample was sent to [facility] for analysis of the multi cancer panel. Results should be available within approximately 2-3 weeks' time, at which point they will be disclosed by telephone to [name], as will any additional recommendations warranted by these results. [name] will receive a summary of her genetic counseling visit and a copy of her results once available. This information will also be  available in [place]. We encouraged [name] to remain in contact with cancer genetics annually so that we can continuously update the family history and inform her of any changes in cancer genetics and testing that may be of benefit for her family. [name] questions were answered to her satisfaction today. Our contact information was provided should additional questions or concerns arise.  Lastly, we encouraged [name] to remain in contact with cancer genetics annually so that we can continuously update the family history and inform her of any changes in cancer genetics and testing that may be of benefit for this family.   Ms.  Christell Faith questions were answered to her satisfaction today. Our contact information was provided should additional questions or concerns arise. Thank you for the referral and allowing [place] to share in the care of your patient.   Karen P. Florene Glen, [place], [facility] Certified Genetic  Counselor [email or web address] phone: [phone]  The patient was seen for a total of 45 minutes in face-to-face genetic counseling.  This patient was discussed with Drs. Magrinat, Lindi Adie and/or Burr Medico who agrees with the above.    _______________________________________________________________________ For Office Staff:  Number of people involved in session: 2 Was an Intern/ student involved with case: no

## 2018-02-16 ENCOUNTER — Telehealth: Payer: Self-pay | Admitting: Genetic Counselor

## 2018-02-16 ENCOUNTER — Encounter: Payer: Self-pay | Admitting: Genetic Counselor

## 2018-02-16 ENCOUNTER — Ambulatory Visit: Payer: Self-pay | Admitting: Genetic Counselor

## 2018-02-16 DIAGNOSIS — Z1379 Encounter for other screening for genetic and chromosomal anomalies: Secondary | ICD-10-CM

## 2018-02-16 NOTE — Progress Notes (Signed)
HPI:  [name] was previously seen in the [place] clinic due to a family history of cancer and concerns regarding a hereditary predisposition to cancer. Please refer to our prior cancer genetics clinic note for more information regarding [name] medical, social and family histories, and our assessment and recommendations, at the time. [name] recent genetic test results were disclosed to her, as were recommendations warranted by these results. These results and recommendations are discussed in more detail below.  CANCER HISTORY:   No history exists.    FAMILY HISTORY:  We obtained a detailed, 4-generation family history.  Significant diagnoses are listed below: Family History  Problem Relation Age of Onset  . Cancer Father        liver  . Migraines Mother   . Breast cancer Mother 66  . Prostate cancer Maternal Uncle 99  . Cancer Paternal Grandmother        NOS    The patient has a son who is cancer free.  She has one brother who is cancer free.  Her father is deceased from liver cancer and her mother was diagnosed with breast cancer at age 6.  The patient's father died at 31.  He had two brothers and two sisters who are cancer free.  His mother died of an unknown cancer.  The patient's mother was diagnosed with breast cancer at age 27.  This was treated with chemotherapy, unilateral mastectomy, radiation and anti-estrogen pills.  She has one brother who was diagnosed with prostate cancer at age 40.  The maternal grandparents are both deceased from unknown causes.  [name] is unaware of previous family history of genetic testing for hereditary cancer risks. Patient's maternal ancestors are of [place] descent, and paternal ancestors are of [place] descent. There is no reported Ashkenazi Jewish ancestry. There is no known consanguinity.  GENETIC TEST RESULTS: Genetic testing reported out on February 15, 2018 through the Multi-cancer  panel found no deleterious mutations.  The Multi-Gene Panel offered by [facility] includes sequencing and/or deletion duplication testing of the following 83 genes: ALK, APC, ATM, AXIN2,BAP1,  BARD1, BLM, BMPR1A, BRCA1, BRCA2, BRIP1, CASR, CDC73, CDH1, CDK4, CDKN1B, CDKN1C, CDKN2A (p14ARF), CDKN2A (p16INK4a), CEBPA, CHEK2, CTNNA1, DICER1, DIS3L2, EGFR (c.2369C>T, p.Thr790Met variant only), EPCAM (Deletion/duplication testing only), FH, FLCN, GATA2, GPC3, GREM1 (Promoter region deletion/duplication testing only), HOXB13 (c.251G>A, p.Gly84Glu), HRAS, KIT, MAX, MEN1, MET, MITF (c.952G>A, p.Glu318Lys variant only), MLH1, MSH2, MSH3, MSH6, MUTYH, NBN, NF1, NF2, NTHL1, PALB2, PDGFRA, PHOX2B, PMS2, POLD1, POLE, POT1, PRKAR1A, PTCH1, PTEN, RAD50, RAD51C, RAD51D, RB1, RECQL4, RET, RUNX1, SDHAF2, SDHA (sequence changes only), SDHB, SDHC, SDHD, SMAD4, SMARCA4, SMARCB1, SMARCE1, STK11, SUFU, TERT, TERT, TMEM127, TP53, TSC1, TSC2, VHL, WRN and WT1.  The test report has been scanned into EPIC and is located under the Molecular Pathology section of the Results Review tab.    We discussed with [name] that since the current genetic testing is not perfect, it is possible there may be a gene mutation in one of these genes that current testing cannot detect, but that chance is small.  We also discussed, that it is possible that another gene that has not yet been discovered, or that we have not yet tested, is responsible for the cancer diagnoses in the family, and it is, therefore, important to remain in touch with cancer genetics in the future so that we can continue to offer [name] the most up to date genetic testing.    CANCER SCREENING  RECOMMENDATIONS:  This normal result is reassuring and indicates that [name] does not likely have an increased risk of cancer due to a mutation in one of these genes.  We, therefore, recommended  [name] continue to follow the cancer screening guidelines provided by her primary  healthcare providers.   An individual's cancer risk and medical management are not determined by genetic test results alone. Overall cancer risk assessment incorporates additional factors, including personal medical history, family history, and any available genetic information that may result in a personalized plan for cancer prevention and surveillance.  Based on [name] personal and family history of cancer, as well as her genetic test results, statistical models (Tyrer Cusik)  and literature data were used to estimate her risk of developing breast cancer. These estimate her lifetime risk of developing breast cancer to be approximately 19.2%.  The patient's lifetime breast cancer risk is a preliminary estimate based on available information using one of several models endorsed by the [place] (ACS). The ACS recommends consideration of breast MRI screening as an adjunct to mammography for patients at high risk (defined as 20% or greater lifetime risk). A more detailed breast cancer risk assessment can be considered, if clinically indicated.   [name] has been determined to be at high risk for breast cancer.  Therefore, we recommend that annual screening with mammography and breast MRI begin at age 47, or 10 years prior to the age of breast cancer diagnosis in a relative (whichever is earlier).  We discussed that [name] should discuss her individual situation with her referring physician and determine a breast cancer screening plan with which they are both comfortable.     RECOMMENDATIONS FOR FAMILY MEMBERS:  Women in this family might be at some increased risk of developing cancer, over the general population risk, simply due to the family history of cancer.  We recommended women in this family have a yearly mammogram beginning at age 26, or 30 years younger than the earliest onset of cancer, an annual clinical breast exam, and perform monthly breast self-exams. Women in this  family should also have a gynecological exam as recommended by their primary provider. All family members should have a colonoscopy by age 60.  Based on [name] family history, we recommended her mother, who was diagnosed with breast cancer at age 74, have genetic counseling and testing. [name] will let us know if we can be of any assistance in coordinating genetic counseling and/or testing for this family member.   FOLLOW-UP: Lastly, we discussed with [name] that cancer genetics is a rapidly advancing field and it is possible that new genetic tests will be appropriate for her and/or her family members in the future. We encouraged her to remain in contact with cancer genetics on an annual basis so we can update her personal and family histories and let her know of advances in cancer genetics that may benefit this family.   Our contact number was provided. [name] questions were answered to her satisfaction, and she knows she is welcome to call us at anytime with additional questions or concerns.   Roma Kayser, MS, [facility] Certified Genetic Counselor [email or web address]

## 2018-02-16 NOTE — Telephone Encounter (Signed)
Using interpreter Norva PavlovEdgar, [phone], I called the patient with results.  Revealed negative genetic testing.  Discussed that we do not know why there is cancer in the family. It could be due to a different gene that we are not testing, or maybe our current technology may not be able to pick something up.  It will be important for her to keep in contact with genetics to keep up with whether additional testing may be needed.

## 2018-03-18 ENCOUNTER — Ambulatory Visit: Payer: BLUE CROSS/BLUE SHIELD | Admitting: Sports Medicine

## 2018-06-07 ENCOUNTER — Other Ambulatory Visit: Payer: Self-pay

## 2018-07-09 ENCOUNTER — Other Ambulatory Visit: Payer: Self-pay | Admitting: Obstetrics and Gynecology

## 2018-07-09 ENCOUNTER — Ambulatory Visit
Admission: RE | Admit: 2018-07-09 | Discharge: 2018-07-09 | Disposition: A | Discharge status: Home / Self Care | Payer: No Typology Code available for payment source | Source: Ambulatory Visit | Attending: Obstetrics and Gynecology | Admitting: Obstetrics and Gynecology

## 2018-07-09 DIAGNOSIS — N6310 Unspecified lump in the right breast, unspecified quadrant: Secondary | ICD-10-CM

## 2018-07-15 ENCOUNTER — Ambulatory Visit
Admission: RE | Admit: 2018-07-15 | Discharge: 2018-07-15 | Disposition: A | Discharge status: Home / Self Care | Payer: BLUE CROSS/BLUE SHIELD | Source: Ambulatory Visit | Attending: Obstetrics and Gynecology | Admitting: Obstetrics and Gynecology

## 2018-07-15 DIAGNOSIS — N6310 Unspecified lump in the right breast, unspecified quadrant: Secondary | ICD-10-CM

## 2018-07-15 HISTORY — PX: BREAST BIOPSY: SHX20

## 2019-03-18 ENCOUNTER — Encounter: Payer: BLUE CROSS/BLUE SHIELD | Admitting: Obstetrics & Gynecology

## 2019-05-11 ENCOUNTER — Encounter: Payer: BC Managed Care – PPO | Admitting: Obstetrics & Gynecology

## 2019-05-31 ENCOUNTER — Other Ambulatory Visit: Payer: Self-pay

## 2019-06-01 ENCOUNTER — Encounter: Payer: BC Managed Care – PPO | Admitting: Obstetrics & Gynecology

## 2019-06-13 ENCOUNTER — Ambulatory Visit: Payer: BC Managed Care – PPO | Admitting: Obstetrics & Gynecology

## 2019-06-13 ENCOUNTER — Encounter: Payer: Self-pay | Admitting: Obstetrics & Gynecology

## 2019-06-13 ENCOUNTER — Other Ambulatory Visit: Payer: Self-pay

## 2019-06-13 VITALS — BP 134/88 | Ht 68.0 in | Wt 202.0 lb

## 2019-06-13 DIAGNOSIS — Z01419 Encounter for gynecological examination (general) (routine) without abnormal findings: Secondary | ICD-10-CM | POA: Diagnosis not present

## 2019-06-13 DIAGNOSIS — B373 Candidiasis of vulva and vagina: Secondary | ICD-10-CM

## 2019-06-13 DIAGNOSIS — N6314 Unspecified lump in the right breast, lower inner quadrant: Secondary | ICD-10-CM

## 2019-06-13 DIAGNOSIS — Z3046 Encounter for surveillance of implantable subdermal contraceptive: Secondary | ICD-10-CM

## 2019-06-13 DIAGNOSIS — B3731 Acute candidiasis of vulva and vagina: Secondary | ICD-10-CM

## 2019-06-13 DIAGNOSIS — R635 Abnormal weight gain: Secondary | ICD-10-CM

## 2019-06-13 MED ORDER — FLUCONAZOLE 150 MG PO TABS: 150.0000 mg | ORAL_TABLET | Freq: Every day | ORAL | 1 refills | Status: DC

## 2019-06-13 NOTE — Progress Notes (Signed)
Sophia Decker 1991-01-17 [id number]   History:    28 y.o. G2P1A1L1 Married.  Son is 82 yo.  RP:  Established patient presenting for annual gyn exam   HPI: On Nexplanon.  No BTB, no pelvic pain.  Has gained significant amount of weight with a BMI of 30.71 now (BMI 25.39 in 11/2017).  No fitness activities.  C/O H/As.  No pain with IC.  Urine/BMs normal. Breasts Left normal, Right c/o tender breast lump coming and going.   Past medical history,surgical history, family history and social history were all reviewed and documented in the EPIC chart.  Gynecologic History Patient's last menstrual period was 06/01/2019. Contraception: Nexplanon and x 2 years Last Pap: 11/2017. Results were: Negative Last mammogram: Never Bone Density: Never Colonoscopy: Never  Obstetric History OB History  Gravida Para Term Preterm AB Living  2 1 0 1 1 1   SAB TAB Ectopic Multiple Live Births    1     1    # Outcome Date GA Lbr Len/2nd Weight Sex Delivery Anes PTL Lv  2 Preterm 08/16/13 [redacted]w[redacted]d / 00:11 3 lb 3.5 oz (1.46 kg) M Vag-Spont EPI  LIV  1 TAB 2012 [redacted]w[redacted]d            Birth Comments: surgical Ab in [place]     ROS: A ROS was performed and pertinent positives and negatives are included in the history.  GENERAL: No fevers or chills. HEENT: No change in vision, no earache, sore throat or sinus congestion. NECK: No pain or stiffness. CARDIOVASCULAR: No chest pain or pressure. No palpitations. PULMONARY: No shortness of breath, cough or wheeze. GASTROINTESTINAL: No abdominal pain, nausea, vomiting or diarrhea, melena or bright red blood per rectum. GENITOURINARY: No urinary frequency, urgency, hesitancy or dysuria. MUSCULOSKELETAL: No joint or muscle pain, no back pain, no recent trauma. DERMATOLOGIC: No rash, no itching, no lesions. ENDOCRINE: No polyuria, polydipsia, no heat or cold intolerance. No recent change in weight. HEMATOLOGICAL: No anemia or easy bruising or bleeding. NEUROLOGIC: No  headache, seizures, numbness, tingling or weakness. PSYCHIATRIC: No depression, no loss of interest in normal activity or change in sleep pattern.     Exam:   BP 134/88   Ht 5\' 8"  (1.727 m)   Wt 202 lb (91.6 kg)   LMP 06/01/2019 Comment: nexplanon  BMI 30.71 kg/m   Body mass index is 30.71 kg/m.  General appearance : Well developed well nourished female. No acute distress HEENT: Eyes: no retinal hemorrhage or exudates,  Neck supple, trachea midline, no carotid bruits, no thyroidmegaly Lungs: Clear to auscultation, no rhonchi or wheezes, or rib retractions  Heart: Regular rate and rhythm, no murmurs or gallops Breast:Examined in sitting and supine position were symmetrical in appearance.  Left breast:  no palpable masses or tenderness,  no skin retraction, no nipple inversion, no nipple discharge, no skin discoloration, no axillary or supraclavicular lymphadenopathy.  Right breast:  Small mobile tender nodule at 4 O'Clock 1 x 1.5 cm.  No skin retraction, no nipple inversion, no nipple discharge, no skin discoloration, no axillary or supraclavicular lymphadenopathy  Abdomen: no palpable masses or tenderness, no rebound or guarding Extremities: no edema or skin discoloration or tenderness  Pelvic: Vulva: Normal             Vagina: No gross lesions or discharge  Cervix: No gross lesions or discharge  Uterus  AV, normal size, shape and consistency, non-tender and mobile  Adnexa  Without masses or tenderness  Anus: Normal   Assessment/Plan:  28 y.o. female for annual exam   1. Well female exam with routine gynecological exam Normal gynecologic exam except for a right small breast lump and yeast vaginitis.  Pap test negative in April 2019, no indication to repeat this year.  Breast exam with a small right breast lump at 4:00.  2. Encounter for surveillance of implantable subdermal contraceptive Weight gain and headaches on Nexplanon.  Patient will come back to remove Nexplanon.  Will  probably use condoms for contraception until attempting conception.  3. Breast lump on right side at 4 o'clock position Small breast lump on the right breast at 4:00 measuring 1 x 1.5 cm and mildly tender.  We will proceed with a right diagnostic mammogram and ultrasound.  We will do a screening mammogram on the left breast.  4. Yeast vaginitis Yeast vaginitis evident clinically.  Fluconazole 150 mg 1 tablet per mouth daily for 3 days prescribed.  Usage reviewed and prescription sent to pharmacy.  Other orders - fluconazole (DIFLUCAN) 150 MG tablet; Take 1 tablet (150 mg total) by mouth daily for 3 days.  Counseling on above issues and coordination of care more than 50% for 10 minutes.  Genia Del MD, 4:31 PM 06/13/2019

## 2019-06-13 NOTE — Patient Instructions (Signed)
1. Well female exam with routine gynecological exam Normal gynecologic exam except for a right small breast lump and yeast vaginitis.  Pap test negative in April 2019, no indication to repeat this year.  Breast exam with a small right breast lump at 4:00.  2. Encounter for surveillance of implantable subdermal contraceptive Weight gain and headaches on Nexplanon.  Patient will come back to remove Nexplanon.  Will probably use condoms for contraception until attempting conception.  3. Breast lump on right side at 4 o'clock position Small breast lump on the right breast at 4:00 measuring 1 x 1.5 cm and mildly tender.  We will proceed with a right diagnostic mammogram and ultrasound.  We will do a screening mammogram on the left breast.  4. Yeast vaginitis Yeast vaginitis evident clinically.  Fluconazole 150 mg 1 tablet per mouth daily for 3 days prescribed.  Usage reviewed and prescription sent to pharmacy.  Other orders - fluconazole (DIFLUCAN) 150 MG tablet; Take 1 tablet (150 mg total) by mouth daily for 3 days.  Sophia Decker, it was a pleasure seeing you today!

## 2019-06-14 ENCOUNTER — Telehealth: Payer: Self-pay | Admitting: *Deleted

## 2019-06-14 DIAGNOSIS — N6314 Unspecified lump in the right breast, lower inner quadrant: Secondary | ICD-10-CM

## 2019-06-14 NOTE — Telephone Encounter (Signed)
Rt breast tender nodule 1 x 1.5 cm at 4 O'Clock needs breast imaging per [facility] staff message.   Patient scheduled on 06/22/19 @ 3:00pm, patient informed with time and date.

## 2019-06-22 ENCOUNTER — Other Ambulatory Visit: Payer: Self-pay

## 2019-06-22 ENCOUNTER — Ambulatory Visit
Admission: RE | Admit: 2019-06-22 | Discharge: 2019-06-22 | Disposition: A | Discharge status: Home / Self Care | Payer: BC Managed Care – PPO | Source: Ambulatory Visit | Attending: Obstetrics & Gynecology | Admitting: Obstetrics & Gynecology

## 2019-06-22 ENCOUNTER — Ambulatory Visit
Admission: RE | Admit: 2019-06-22 | Discharge: 2019-06-22 | Disposition: A | Discharge status: Home / Self Care | Payer: BLUE CROSS/BLUE SHIELD | Source: Ambulatory Visit | Attending: Obstetrics & Gynecology | Admitting: Obstetrics & Gynecology

## 2019-06-22 DIAGNOSIS — N6314 Unspecified lump in the right breast, lower inner quadrant: Secondary | ICD-10-CM

## 2019-11-10 ENCOUNTER — Other Ambulatory Visit: Payer: Self-pay

## 2019-11-11 ENCOUNTER — Ambulatory Visit: Payer: BC Managed Care – PPO | Admitting: Obstetrics & Gynecology

## 2019-11-11 ENCOUNTER — Encounter: Payer: Self-pay | Admitting: Obstetrics & Gynecology

## 2019-11-11 VITALS — BP 140/86

## 2019-11-11 DIAGNOSIS — Z30011 Encounter for initial prescription of contraceptive pills: Secondary | ICD-10-CM

## 2019-11-11 DIAGNOSIS — Z3046 Encounter for surveillance of implantable subdermal contraceptive: Secondary | ICD-10-CM

## 2019-11-11 MED ORDER — NORETHIN ACE-ETH ESTRAD-FE 1-20 MG-MCG(24) PO TABS: 1.0000 | ORAL_TABLET | Freq: Every day | ORAL | 4 refills | Status: DC

## 2019-11-11 NOTE — Patient Instructions (Signed)
1. Encounter for Nexplanon removal Time to remove Nexplanon after 3 years.  Procedure explained to patient.  Easy removal of Nexplanon without complication.  Well-tolerated by patient.  Postprocedure precautions discussed.  2. Encounter for initial prescription of contraceptive pills Counseling on contraception.  Decision to start on a low dosage birth control pill.  No contraindication.  Usage, risks and benefits reviewed.  The generic of Loestrin 24 FE 1/20 prescribed.  Other orders - Norethindrone Acetate-Ethinyl Estrad-FE (LOESTRIN 24 FE) 1-20 MG-MCG(24) tablet; Take 1 tablet by mouth daily.  Sophia Decker, it was a pleasure seeing you today!

## 2019-11-11 NOTE — Progress Notes (Signed)
Sophia Decker December 21, 1990 [id number]        29 y.o.  G2P0111   RP: Nexplanon removal after 3 yrs  HPI: Insertion of Nexplanon in the spring of 2018, time to remove after 3 yrs.  Would like an alternative type of contracption.  Has occasional H/As and increased weight with Nexplanon.   OB History  Gravida Para Term Preterm AB Living  2 1 0 1 1 1   SAB TAB Ectopic Multiple Live Births    1     1    # Outcome Date GA Lbr Len/2nd Weight Sex Delivery Anes PTL Lv  2 Preterm 08/16/13 [redacted]w[redacted]d / 00:11 3 lb 3.5 oz (1.46 kg) M Vag-Spont EPI  LIV  1 TAB 2012 [redacted]w[redacted]d            Birth Comments: surgical Ab in [redacted]w[redacted]d    Past medical history,surgical history, problem list, medications, allergies, family history and social history were all reviewed and documented in the EPIC chart.   Directed ROS with pertinent positives and negatives documented in the history of present illness/assessment and plan.  Exam:  Vitals:   11/11/19 1104  BP: 140/86   General appearance:  Normal                                                             Nexplanon procedure note (removal)  The patient presented to the office today requesting for removal of her Nexplanon that was placed in the year 2018 on her left arm.   On examination the nexplanon implant was palpated and the distal end  (end  closest to the elbow) was marked. The area was sterilized with Betadine solution. 1% lidocaine was used for local anesthesia and approximately 1 cc  was injected into the site that was marked where the incision was to be made. The local anesthetic was injected under the implant in an effort to keep it  close to the skin surface. Slight pressure pushing downward was made at the proximal end  of the implant in an effort to stabilize it. A bulge appeared indicating the distal end of the implant. A small transverse incision of 2 mm was made at that location. By gently pushing the implant toward the incision, the tip became  visible. Grasping the implant with a curved forcep facilitated in gently removing the implant. Full confirmation of the entire implant which is 4 cm long was inspected and was intact and was shown to the patient and discarded. After removing the implant, the incision was closed with 3Steri-Strips, a band-aid and a bandage. Patient will be instructed to remove the pressure bandage in 24 hours, the band-aid in 3 days and the Steri-Strips in 7 days.    Assessment/Plan:  29 y.o. 38   1. Encounter for Nexplanon removal Time to remove Nexplanon after 3 years.  Procedure explained to patient.  Easy removal of Nexplanon without complication.  Well-tolerated by patient.  Postprocedure precautions discussed.  2. Encounter for initial prescription of contraceptive pills Counseling on contraception.  Decision to start on a low dosage birth control pill.  No contraindication.  Usage, risks and benefits reviewed.  The generic of Loestrin 24 FE 1/20 prescribed.  Other orders - Norethindrone Acetate-Ethinyl Estrad-FE (LOESTRIN 24 FE) 1-20 MG-MCG(24) tablet;  Take 1 tablet by mouth daily.  Princess Bruins MD, 12:08 PM 11/11/2019

## 2019-11-29 ENCOUNTER — Ambulatory Visit: Payer: BC Managed Care – PPO | Admitting: Obstetrics & Gynecology

## 2019-12-12 DIAGNOSIS — M25562 Pain in left knee: Secondary | ICD-10-CM | POA: Diagnosis not present

## 2019-12-12 DIAGNOSIS — M25561 Pain in right knee: Secondary | ICD-10-CM | POA: Diagnosis not present

## 2020-04-04 DIAGNOSIS — Z20822 Contact with and (suspected) exposure to covid-19: Secondary | ICD-10-CM | POA: Diagnosis not present

## 2020-07-03 ENCOUNTER — Other Ambulatory Visit: Payer: Self-pay | Admitting: Obstetrics & Gynecology

## 2020-07-04 NOTE — Telephone Encounter (Signed)
Scheduled for annual exam with Dr. Mackey Birchwood 07/06/20.

## 2020-07-06 ENCOUNTER — Other Ambulatory Visit: Payer: Self-pay

## 2020-07-06 ENCOUNTER — Ambulatory Visit: Payer: BC Managed Care – PPO | Admitting: Obstetrics & Gynecology

## 2020-07-06 ENCOUNTER — Encounter: Payer: Self-pay | Admitting: Obstetrics & Gynecology

## 2020-07-06 VITALS — BP 130/82 | Ht 68.0 in | Wt 161.0 lb

## 2020-07-06 DIAGNOSIS — Z3041 Encounter for surveillance of contraceptive pills: Secondary | ICD-10-CM

## 2020-07-06 DIAGNOSIS — Z01419 Encounter for gynecological examination (general) (routine) without abnormal findings: Secondary | ICD-10-CM | POA: Diagnosis not present

## 2020-07-06 MED ORDER — NORETHIN ACE-ETH ESTRAD-FE 1-20 MG-MCG(24) PO TABS: 1.0000 | ORAL_TABLET | Freq: Every day | ORAL | 4 refills | Status: DC

## 2020-07-06 NOTE — Progress Notes (Signed)
Sophia Decker 1991-04-03 [id number]   History:    29 y.o. G2P1A1L1 Married.  Son is 6++ yo.  RP:  Established patient presenting for annual gyn exam   HPI: Well on Junel Fe 1/20.  No BTB, no pelvic pain.  No pain with IC.  Urine/BMs normal  Breasts Fibrocystic, stable.  Mother with Breast Ca at age 10, well post mastectomy.  BMI much better at 24.48.  Good fitness and nutrition.  Will f/u for Fasting health labs.  Past medical history,surgical history, family history and social history were all reviewed and documented in the EPIC chart.  Gynecologic History Patient's last menstrual period was 06/29/2020.  Obstetric History OB History  Gravida Para Term Preterm AB Living  2 1 0 _0 SAB TAB Ectopic Multiple Live Births    1     1    # Outcome Date GA Lbr Len/2nd Weight Sex Delivery Anes PTL Lv  2 Preterm 08/16/13 [redacted]w[redacted]d/ 00:11 3 lb 3.5 oz (1.46 kg) M Vag-Spont EPI  LIV  1 TAB 2012 [id number]          Birth Comments[place] Ab in [place]   ROS: A ROS was performed and pertinent positives and negatives are included in the history.  GENERAL: No fevers or chills. HEENT: No change in vision, no earache, sore throat or sinus congestion. NECK: No pain or stiffness. CARDIOVASCULAR: No chest pain or pressure. No palpitations. PULMONARY: No shortness of breath, cough or wheeze. GASTROINTESTINAL: No abdominal pain, nausea, vomiting or diarrhea, melena or bright red blood per rectum. GENITOURINARY: No urinary frequency, urgency, hesitancy or dysuria. MUSCULOSKELETAL: No joint or muscle pain, no back pain, no recent trauma. DERMATOLOGIC: No rash, no itching, no lesions. ENDOCRINE: No polyuria, polydipsia, no heat or cold intolerance. No recent change in weight. HEMATOLOGICAL: No anemia or easy bruising or bleeding. NEUROLOGIC: No headache, seizures, numbness, tingling or weakness. PSYCHIATRIC: No depression, no loss of interest in normal activity or change in sleep pattern.      Exam:   BP 130/82   Ht _1  (1.727 m)   Wt 161 lb (73 kg)   LMP 06/29/2020 Comment: pill  BMI 24.48 kg/m   Body mass index is 24.48 kg/m.  General appearance : Well developed well nourished female. No acute distress HEENT: Eyes: no retinal hemorrhage or exudates,  Neck supple, trachea midline, no carotid bruits, no thyroidmegaly Lungs: Clear to auscultation, no rhonchi or wheezes, or rib retractions  Heart: Regular rate and rhythm, no murmurs or gallops Breast:Examined in sitting and supine position were symmetrical in appearance, no palpable masses or tenderness,  no skin retraction, no nipple inversion, no nipple discharge, no skin discoloration, no axillary or supraclavicular lymphadenopathy Abdomen: no palpable masses or tenderness, no rebound or guarding Extremities: no edema or skin discoloration or tenderness  Pelvic: Vulva: Normal             Vagina: No gross lesions or discharge  Cervix: No gross lesions or discharge.  Pap reflex done.  Uterus  AV, normal size, shape and consistency, non-tender and mobile  Adnexa  Without masses or tenderness  Anus: Normal   Assessment/Plan:  29 1?o. female for annual exam   1. Encounter for routine gynecological examination with Papanicolaou smear of cervix Normal gynecologic exam.  Pap reflex done.  Breast exam normal.  Bilateral diagnostic mammogram benign in October 2020 with right breast ultrasound benign.  Mother with breast  cancer at age 88.  We will follow-up here for fasting health labs. - CBC; Future - Comp Met (CMET); Future - Lipid panel; Future - TSH; Future - VITAMIN D 25 Hydroxy (Vit-D Deficiency, Fractures); Future  2. Encounter for surveillance of contraceptive pills Well on Loestrin generic 24 FE 1/20.  No contraindication to continue.  Prescription sent to pharmacy.  Other orders - Norethindrone Acetate-Ethinyl Estrad-FE (LOESTRIN 24 FE) 1-20 MG-MCG(24) tablet; Take 1 tablet by mouth daily.  Princess Bruins MD, 3:56 PM 07/06/2020

## 2020-07-08 ENCOUNTER — Encounter: Payer: Self-pay | Admitting: Obstetrics & Gynecology

## 2020-07-09 DIAGNOSIS — Z01419 Encounter for gynecological examination (general) (routine) without abnormal findings: Secondary | ICD-10-CM | POA: Diagnosis not present

## 2020-07-09 NOTE — Addendum Note (Signed)
Addended by: Berna Spare A on: 07/09/2020 09:47 AM   Modules accepted: Orders

## 2020-07-10 LAB — PAP IG W/ RFLX HPV ASCU

## 2020-07-13 ENCOUNTER — Other Ambulatory Visit: Payer: Self-pay

## 2020-07-13 ENCOUNTER — Other Ambulatory Visit: Payer: BC Managed Care – PPO

## 2020-07-13 DIAGNOSIS — Z01419 Encounter for gynecological examination (general) (routine) without abnormal findings: Secondary | ICD-10-CM

## 2020-07-13 DIAGNOSIS — Z13220 Encounter for screening for lipoid disorders: Secondary | ICD-10-CM | POA: Diagnosis not present

## 2020-07-13 DIAGNOSIS — Z1329 Encounter for screening for other suspected endocrine disorder: Secondary | ICD-10-CM | POA: Diagnosis not present

## 2020-07-13 DIAGNOSIS — E559 Vitamin D deficiency, unspecified: Secondary | ICD-10-CM | POA: Diagnosis not present

## 2020-07-13 LAB — CBC
Hemoglobin: 14.3 g/dL (ref 11.7–15.5)
MCHC: 33.6 g/dL (ref 32.0–36.0)
WBC: 6 10*3/uL (ref 3.8–10.8)

## 2020-07-14 LAB — CBC
HCT: 42.6 % (ref 35.0–45.0)
MCH: 30.6 pg (ref 27.0–33.0)
MCV: 91.2 fL (ref 80.0–100.0)
MPV: 10.8 fL (ref 7.5–12.5)
Platelets: 288 10*3/uL (ref 140–400)
RBC: 4.67 10*6/uL (ref 3.80–5.10)
RDW: 12.5 % (ref 11.0–15.0)

## 2020-07-14 LAB — LIPID PANEL
Cholesterol: 159 mg/dL (ref <200)
HDL: 52 mg/dL (ref >=50)
LDL Cholesterol (Calc): 93 mg/dL (calc)
Non-HDL Cholesterol (Calc): 107 mg/dL (calc) (ref <130)
Total CHOL/HDL Ratio: 3.1 (calc) (ref <5.0)
Triglycerides: 47 mg/dL (ref <150)

## 2020-07-14 LAB — COMPREHENSIVE METABOLIC PANEL
AG Ratio: 1.5 (calc) (ref 1.0–2.5)
ALT: 25 U/L (ref 6–29)
AST: 20 U/L (ref 10–30)
Albumin: 4.2 g/dL (ref 3.6–5.1)
Alkaline phosphatase (APISO): 79 U/L (ref 31–125)
BUN: 8 mg/dL (ref 7–25)
CO2: 20 mmol/L (ref 20–32)
Calcium: 9.3 mg/dL (ref 8.6–10.2)
Chloride: 106 mmol/L (ref 98–110)
Creat: 0.8 mg/dL (ref 0.50–1.10)
Globulin: 2.8 g/dL (calc) (ref 1.9–3.7)
Glucose, Bld: 85 mg/dL (ref 65–99)
Potassium: 4.1 mmol/L (ref 3.5–5.3)
Sodium: 137 mmol/L (ref 135–146)
Total Bilirubin: 0.5 mg/dL (ref 0.2–1.2)
Total Protein: 7 g/dL (ref 6.1–8.1)

## 2020-07-14 LAB — TSH: TSH: 1.57 mIU/L

## 2020-07-14 LAB — VITAMIN D 25 HYDROXY (VIT D DEFICIENCY, FRACTURES): Vit D, 25-Hydroxy: 26 ng/mL — ABNORMAL LOW (ref 30–100)

## 2020-07-17 DIAGNOSIS — F411 Generalized anxiety disorder: Secondary | ICD-10-CM | POA: Diagnosis not present

## 2020-07-17 DIAGNOSIS — F321 Major depressive disorder, single episode, moderate: Secondary | ICD-10-CM | POA: Diagnosis not present

## 2020-07-28 DIAGNOSIS — F411 Generalized anxiety disorder: Secondary | ICD-10-CM | POA: Diagnosis not present

## 2020-07-28 DIAGNOSIS — F321 Major depressive disorder, single episode, moderate: Secondary | ICD-10-CM | POA: Diagnosis not present

## 2020-08-02 NOTE — Telephone Encounter (Signed)
Spoke with patient and informed her. °

## 2020-08-10 DIAGNOSIS — F411 Generalized anxiety disorder: Secondary | ICD-10-CM | POA: Diagnosis not present

## 2020-08-10 DIAGNOSIS — F321 Major depressive disorder, single episode, moderate: Secondary | ICD-10-CM | POA: Diagnosis not present

## 2020-08-20 DIAGNOSIS — U071 COVID-19: Secondary | ICD-10-CM | POA: Diagnosis not present

## 2020-08-20 DIAGNOSIS — F411 Generalized anxiety disorder: Secondary | ICD-10-CM | POA: Diagnosis not present

## 2020-08-20 DIAGNOSIS — J029 Acute pharyngitis, unspecified: Secondary | ICD-10-CM | POA: Diagnosis not present

## 2020-08-20 DIAGNOSIS — F321 Major depressive disorder, single episode, moderate: Secondary | ICD-10-CM | POA: Diagnosis not present

## 2020-08-21 DIAGNOSIS — F321 Major depressive disorder, single episode, moderate: Secondary | ICD-10-CM | POA: Diagnosis not present

## 2020-08-21 DIAGNOSIS — F411 Generalized anxiety disorder: Secondary | ICD-10-CM | POA: Diagnosis not present

## 2020-08-28 DIAGNOSIS — Z20822 Contact with and (suspected) exposure to covid-19: Secondary | ICD-10-CM | POA: Diagnosis not present

## 2021-07-08 ENCOUNTER — Ambulatory Visit: Payer: BC Managed Care – PPO | Admitting: Obstetrics & Gynecology

## 2021-08-11 ENCOUNTER — Other Ambulatory Visit: Payer: Self-pay | Admitting: Obstetrics & Gynecology

## 2021-08-12 NOTE — Telephone Encounter (Signed)
Annual exam scheduled 10/22/20

## 2021-09-03 ENCOUNTER — Other Ambulatory Visit: Payer: Self-pay

## 2021-09-03 ENCOUNTER — Encounter: Payer: Self-pay | Admitting: Obstetrics & Gynecology

## 2021-09-03 ENCOUNTER — Ambulatory Visit: Payer: BC Managed Care – PPO | Admitting: Obstetrics & Gynecology

## 2021-09-03 VITALS — BP 118/86 | HR 82

## 2021-09-03 DIAGNOSIS — N898 Other specified noninflammatory disorders of vagina: Secondary | ICD-10-CM | POA: Diagnosis not present

## 2021-09-03 DIAGNOSIS — R102 Pelvic and perineal pain: Secondary | ICD-10-CM

## 2021-09-03 DIAGNOSIS — R61 Generalized hyperhidrosis: Secondary | ICD-10-CM | POA: Diagnosis not present

## 2021-09-03 LAB — WET PREP FOR TRICH, YEAST, CLUE

## 2021-09-03 MED ORDER — FLUCONAZOLE 150 MG PO TABS: 150.0000 mg | ORAL_TABLET | Freq: Once | ORAL | 1 refills | Status: AC

## 2021-09-03 MED ORDER — TINIDAZOLE 500 MG PO TABS: 1000.0000 mg | ORAL_TABLET | Freq: Two times a day (BID) | ORAL | 1 refills | Status: AC

## 2021-09-03 NOTE — Progress Notes (Signed)
° ° °  Sophia Decker 02-Apr-1991 [id number]        31 y.o.  G2P0111   RP: Increased vaginal discharge with pelvic discomfort  HPI:  Increased vaginal discharge with pelvic discomfort x a few weeks.  Well on BCPs.  Last 2 packs didn't have a withdrawal bleed.  No UTI Sx.  BMs normal.  No fever.   OB History  Gravida Para Term Preterm AB Living  2 1 0 1 1 1   SAB IAB Ectopic Multiple Live Births    1     1    # Outcome Date GA Lbr Len/2nd Weight Sex Delivery Anes PTL Lv  2 Preterm 08/16/13 [redacted]w[redacted]d / 00:11 3 lb 3.5 oz (1.46 kg) M Vag-Spont EPI  LIV  1 IAB 2012 [redacted]w[redacted]d            Birth Comments: surgical Ab in [redacted]w[redacted]d    Past medical history,surgical history, problem list, medications, allergies, family history and social history were all reviewed and documented in the EPIC chart.   Directed ROS with pertinent positives and negatives documented in the history of present illness/assessment and plan.  Exam:  Vitals:   09/03/21 0933  BP: 118/86  Pulse: 82  SpO2: 98%   General appearance:  Normal  Abdomen: Normal  Gynecologic exam: Vulva normal.  Speculum:  Cervix/Vagina normal.  Increased creamy discharge.  Wet prep done.  Bimanual exam:  Uterus AV, normal volume, mobile, mildly tender.  No adnexal mass, NT.  Wet prep: Clue cells with odor   Assessment/Plan:  30 y.o. 11/01/21   1. Vaginal discharge Bacterial Vaginosis confirmed by Wet prep.  Will treat with Tinidazole 2 tab PO BID x 2 days.  Follow with 1 tab of Fluconazole to treat or prevent a Yeast Vaginitis.  Usage reviewed and prescriptions sent to pharmacy. - WET PREP FOR TRICH, YEAST, CLUE  2. Pelvic pain in female BV diagnosed.  Will treat.  Gynecologic exam normal except for mild tenderness.  Will check WBC count on the CBC.  If no improvement of Sxs, patient to let me know at her Annual Gyn exam and further investigation with a Pelvic [id number] will be done. - CBC  3. Excessive sweating Excessive sweating mainly at  the pubic area.  Will consult with Dermato as needed. - TSH  Other orders - tinidazole (TINDAMAX) 500 MG tablet; Take 2 tablets (1,000 mg total) by mouth 2 (two) times daily for 2 days. - fluconazole (DIFLUCAN) 150 MG tablet; Take 1 tablet (150 mg total) by mouth once for 1 dose.    [place] MD, 10:11 AM 09/03/2021

## 2021-10-22 ENCOUNTER — Other Ambulatory Visit (HOSPITAL_COMMUNITY)
Admission: RE | Admit: 2021-10-22 | Discharge: 2021-10-22 | Disposition: A | Discharge status: Home / Self Care | Payer: BC Managed Care – PPO | Source: Ambulatory Visit | Attending: Obstetrics & Gynecology | Admitting: Obstetrics & Gynecology

## 2021-10-22 ENCOUNTER — Encounter: Payer: Self-pay | Admitting: Obstetrics & Gynecology

## 2021-10-22 ENCOUNTER — Other Ambulatory Visit: Payer: Self-pay

## 2021-10-22 ENCOUNTER — Ambulatory Visit (INDEPENDENT_AMBULATORY_CARE_PROVIDER_SITE_OTHER): Payer: BC Managed Care – PPO | Admitting: Obstetrics & Gynecology

## 2021-10-22 VITALS — BP 120/76 | HR 72 | Resp 16 | Ht 67.75 in | Wt 176.0 lb

## 2021-10-22 DIAGNOSIS — Z803 Family history of malignant neoplasm of breast: Secondary | ICD-10-CM

## 2021-10-22 DIAGNOSIS — Z01419 Encounter for gynecological examination (general) (routine) without abnormal findings: Secondary | ICD-10-CM | POA: Insufficient documentation

## 2021-10-22 DIAGNOSIS — Z3041 Encounter for surveillance of contraceptive pills: Secondary | ICD-10-CM | POA: Diagnosis not present

## 2021-10-22 MED ORDER — JUNEL FE 24 1-20 MG-MCG(24) PO TABS: 1.0000 | ORAL_TABLET | Freq: Every day | ORAL | 4 refills | Status: DC

## 2021-10-22 NOTE — Progress Notes (Signed)
Sophia Decker 03-Mar-1991 [id number]   History:    31 y.o. G2P1A1L1 Married.  Son is 19 yo.   RP:  Established patient presenting for annual gyn exam    HPI: Well on Junel Fe 1/20.  No BTB, no pelvic pain.  No pain with IC.  Pap reflex done.  Urine/BMs normal  Breasts stable with Rt breast Fibrocystic confirmed by Bx in 2020.  Thinking of having breasts lift and augmentation. Mother with Breast Ca at age 25, well post mastectomy. BMI at 26.96. Good fitness and nutrition. Fasting health labs 06/2020 completely Negative.  Past medical history,surgical history, family history and social history were all reviewed and documented in the EPIC chart.  Gynecologic History Patient's last menstrual period was 09/23/2021 (exact date).  Obstetric History OB History  Gravida Para Term Preterm AB Living  2 1 0 1 1 1   SAB IAB Ectopic Multiple Live Births    1     1    # Outcome Date GA Lbr Len/2nd Weight Sex Delivery Anes PTL Lv  2 Preterm 08/16/13 [redacted]w[redacted]d / 00:11 3 lb 3.5 oz (1.46 kg) M Vag-Spont EPI  LIV  1 IAB 2012 [redacted]w[redacted]d            Birth Comments: surgical Ab in [place]     ROS: A ROS was performed and pertinent positives and negatives are included in the history.  GENERAL: No fevers or chills. HEENT: No change in vision, no earache, sore throat or sinus congestion. NECK: No pain or stiffness. CARDIOVASCULAR: No chest pain or pressure. No palpitations. PULMONARY: No shortness of breath, cough or wheeze. GASTROINTESTINAL: No abdominal pain, nausea, vomiting or diarrhea, melena or bright red blood per rectum. GENITOURINARY: No urinary frequency, urgency, hesitancy or dysuria. MUSCULOSKELETAL: No joint or muscle pain, no back pain, no recent trauma. DERMATOLOGIC: No rash, no itching, no lesions. ENDOCRINE: No polyuria, polydipsia, no heat or cold intolerance. No recent change in weight. HEMATOLOGICAL: No anemia or easy bruising or bleeding. NEUROLOGIC: No headache, seizures, numbness, tingling  or weakness. PSYCHIATRIC: No depression, no loss of interest in normal activity or change in sleep pattern.     Exam:   BP 120/76    Pulse 72    Resp 16    Ht 5' 7.75" (1.721 m)    Wt 176 lb (79.8 kg)    LMP 09/23/2021 (Exact Date) Comment: ocps   BMI 26.96 kg/m   Body mass index is 26.96 kg/m.  General appearance : Well developed well nourished female. No acute distress HEENT: Eyes: no retinal hemorrhage or exudates,  Neck supple, trachea midline, no carotid bruits, no thyroidmegaly Lungs: Clear to auscultation, no rhonchi or wheezes, or rib retractions  Heart: Regular rate and rhythm, no murmurs or gallops Breast:Examined in sitting and supine position were symmetrical in appearance, no palpable masses or tenderness,  no skin retraction, no nipple inversion, no nipple discharge, no skin discoloration, no axillary or supraclavicular lymphadenopathy Abdomen: no palpable masses or tenderness, no rebound or guarding Extremities: no edema or skin discoloration or tenderness  Pelvic: Vulva: Normal             Vagina: No gross lesions or discharge  Cervix: No gross lesions or discharge.  Pap reflex done.  Uterus  AV, normal size, shape and consistency, non-tender and mobile  Adnexa  Without masses or tenderness  Anus: Normal   Assessment/Plan:  31 y.o. female for annual exam   1. Encounter for routine gynecological  examination with Papanicolaou smear of cervix Well on Junel Fe 1/20.  No BTB, no pelvic pain.  No pain with IC.  Pap reflex done.  Urine/BMs normal  Breasts stable with Rt breast Fibrocystic confirmed by Bx in 2020.  Thinking of having breasts lift and augmentation. Mother with Breast Ca at age 72, well post mastectomy. BMI at 26.96. Good fitness and nutrition. Fasting health labs 06/2020 completely Negative. - Cytology - PAP( [place])  2. Encounter for surveillance of contraceptive pills Well on Junel Fe 24 1/20.  No CI.  Prescription sent to pharmacy.  3. Family  history of breast cancer Mother with Breast Ca at age 69.  Other orders - Prenatal Vit-Fe Fumarate-FA (PRENATAL VITAMIN PO); Take by mouth. - Norethindrone Acetate-Ethinyl Estrad-FE (JUNEL FE 24) 1-20 MG-MCG(24) tablet; Take 1 tablet by mouth daily.   Genia Del MD, 4:21 PM 10/22/2021

## 2021-10-23 LAB — CYTOLOGY - PAP: Diagnosis: NEGATIVE

## 2021-10-30 ENCOUNTER — Encounter: Payer: Self-pay | Admitting: Obstetrics & Gynecology

## 2021-10-31 ENCOUNTER — Other Ambulatory Visit: Payer: Self-pay

## 2021-10-31 ENCOUNTER — Other Ambulatory Visit: Payer: Self-pay | Admitting: Obstetrics & Gynecology

## 2021-10-31 DIAGNOSIS — Z01818 Encounter for other preprocedural examination: Secondary | ICD-10-CM

## 2021-11-04 ENCOUNTER — Other Ambulatory Visit: Payer: Self-pay

## 2021-11-04 ENCOUNTER — Ambulatory Visit
Admission: RE | Admit: 2021-11-04 | Discharge: 2021-11-04 | Disposition: A | Discharge status: Home / Self Care | Payer: BC Managed Care – PPO | Source: Ambulatory Visit | Attending: Obstetrics & Gynecology | Admitting: Obstetrics & Gynecology

## 2021-11-04 DIAGNOSIS — Z01818 Encounter for other preprocedural examination: Secondary | ICD-10-CM

## 2021-11-06 ENCOUNTER — Other Ambulatory Visit: Payer: Self-pay | Admitting: Obstetrics & Gynecology

## 2021-11-06 DIAGNOSIS — R928 Other abnormal and inconclusive findings on diagnostic imaging of breast: Secondary | ICD-10-CM

## 2021-11-07 ENCOUNTER — Ambulatory Visit
Admission: RE | Admit: 2021-11-07 | Discharge: 2021-11-07 | Disposition: A | Discharge status: Home / Self Care | Payer: BC Managed Care – PPO | Source: Ambulatory Visit | Attending: Obstetrics & Gynecology | Admitting: Obstetrics & Gynecology

## 2021-11-07 ENCOUNTER — Ambulatory Visit: Admission: RE | Admit: 2021-11-07 | Payer: BC Managed Care – PPO | Source: Ambulatory Visit

## 2021-11-07 ENCOUNTER — Other Ambulatory Visit: Payer: Self-pay

## 2022-10-23 ENCOUNTER — Other Ambulatory Visit (HOSPITAL_COMMUNITY)
Admission: RE | Admit: 2022-10-23 | Discharge: 2022-10-23 | Disposition: A | Discharge status: Home / Self Care | Payer: BC Managed Care – PPO | Source: Ambulatory Visit | Attending: Obstetrics & Gynecology | Admitting: Obstetrics & Gynecology

## 2022-10-23 ENCOUNTER — Encounter: Payer: Self-pay | Admitting: Obstetrics & Gynecology

## 2022-10-23 ENCOUNTER — Ambulatory Visit (INDEPENDENT_AMBULATORY_CARE_PROVIDER_SITE_OTHER): Payer: BC Managed Care – PPO | Admitting: Obstetrics & Gynecology

## 2022-10-23 VITALS — BP 110/80 | HR 70 | Resp 16 | Ht 68.0 in | Wt 177.0 lb

## 2022-10-23 DIAGNOSIS — Z01419 Encounter for gynecological examination (general) (routine) without abnormal findings: Secondary | ICD-10-CM | POA: Diagnosis not present

## 2022-10-23 DIAGNOSIS — R61 Generalized hyperhidrosis: Secondary | ICD-10-CM

## 2022-10-23 DIAGNOSIS — Z3041 Encounter for surveillance of contraceptive pills: Secondary | ICD-10-CM

## 2022-10-23 MED ORDER — JUNEL FE 24 1-20 MG-MCG(24) PO TABS: 1.0000 | ORAL_TABLET | Freq: Every day | ORAL | 4 refills | Status: DC

## 2022-10-23 NOTE — Progress Notes (Signed)
Sophia Decker 1990-08-27 [id number]   History:    32 y.o. G2P1A1L1 Married.  Son is 79 yo.   RP:  Established patient presenting for annual gyn exam    HPI: Well on Junel Fe 1/20.  No BTB, no pelvic pain.  No pain with IC. Pap Neg 09/2021. Pap reflex done.  C/O night sweats, fatigue and hair loss.  Urine/BMs normal  Breasts S/P Augmentation. Mammo prior to bilateral augmentation in 10/2021, Lt Neg, Rt Dx mammo Neg.  Mother with Breast Ca at age 27, well post mastectomy. BMI at 26.91. Good fitness and nutrition. Fasting health labs 06/2020 completely Negative.    Past medical history,surgical history, family history and social history were all reviewed and documented in the EPIC chart.  Gynecologic History No LMP recorded. (Menstrual status: Oral contraceptives).  Obstetric History OB History  Gravida Para Term Preterm AB Living  2 1 0 '1 1 1  '$ SAB IAB Ectopic Multiple Live Births    1     1    # Outcome Date GA Lbr Len/2nd Weight Sex Delivery Anes PTL Lv  2 Preterm 08/16/13 [redacted]w[redacted]d/ 00:11 3 lb 3.5 oz (1.46 kg) M Vag-Spont EPI  LIV  1 IAB 2012 [id number]          Birth Comments[place]   ROS: A ROS was performed and pertinent positives and negatives are included in the history. GENERAL: No fevers or chills. HEENT: No change in vision, no earache, sore throat or sinus congestion. NECK: No pain or stiffness. CARDIOVASCULAR: No chest pain or pressure. No palpitations. PULMONARY: No shortness of breath, cough or wheeze. GASTROINTESTINAL: No abdominal pain, nausea, vomiting or diarrhea, melena or bright red blood per rectum. GENITOURINARY: No urinary frequency, urgency, hesitancy or dysuria. MUSCULOSKELETAL: No joint or muscle pain, no back pain, no recent trauma. DERMATOLOGIC: No rash, no itching, no lesions. ENDOCRINE: No polyuria, polydipsia, no heat or cold intolerance. No recent change in weight. HEMATOLOGICAL: No anemia or easy bruising or bleeding. NEUROLOGIC: No headache,  seizures, numbness, tingling or weakness. PSYCHIATRIC: No depression, no loss of interest in normal activity or change in sleep pattern.     Exam:   BP 110/80   Pulse 70   Resp 16   Ht '5\' 8"'$  (1.727 m)   Wt 177 lb (80.3 kg)   BMI 26.91 kg/m   Body mass index is 26.91 kg/m.  General appearance : Well developed well nourished female. No acute distress HEENT: Eyes: no retinal hemorrhage or exudates,  Neck supple, trachea midline, no carotid bruits, no thyroidmegaly Lungs: Clear to auscultation, no rhonchi or wheezes, or rib retractions  Heart: Regular rate and rhythm, no murmurs or gallops Breast:Examined in sitting and supine position were symmetrical in appearance, no palpable masses or tenderness,  no skin retraction, no nipple inversion, no nipple discharge, no skin discoloration, no axillary or supraclavicular lymphadenopathy Abdomen: no palpable masses or tenderness, no rebound or guarding Extremities: no edema or skin discoloration or tenderness  Pelvic: Vulva: Normal             Vagina: No gross lesions or discharge  Cervix: No gross lesions or discharge. Pap reflex done.  Uterus  AV, normal size, shape and consistency, non-tender and mobile  Adnexa  Without masses or tenderness  Anus: Normal   Assessment/Plan:  [age].o. female for annual exam   1. Encounter for routine gynecological examination with Papanicolaou smear of cervix Well  on Junel Fe 1/20.  No BTB, no pelvic pain.  No pain with IC. Pap Neg 09/2021. Pap reflex done.  C/O night sweats, fatigue and hair loss.  Urine/BMs normal  Breasts S/P Augmentation. Mammo prior to bilateral augmentation in 10/2021, Lt Neg, Rt Dx mammo Neg.  Mother with Breast Ca at age 70, well post mastectomy. BMI at 26.91. Good fitness and nutrition. Fasting health labs 06/2020 completely Negative. - Cytology - PAP( [place])  2. Encounter for surveillance of contraceptive pills Well on Junel 1/20.  No CI to continue.  Prescription sent to  pharmacy.  3. Night sweats R/O thysroid dysfunction.  TSH today. - TSH  Other orders - Norethindrone Acetate-Ethinyl Estrad-FE (JUNEL FE 24) 1-20 MG-MCG(24) tablet; Take 1 tablet by mouth daily.   Princess Bruins MD, 4:30 PM

## 2022-10-24 LAB — TSH: TSH: 1.4 mIU/L

## 2022-10-28 LAB — CYTOLOGY - PAP
Comment: NEGATIVE
Diagnosis: NEGATIVE
High risk HPV: NEGATIVE

## 2022-10-30 ENCOUNTER — Other Ambulatory Visit: Payer: Self-pay

## 2022-10-30 DIAGNOSIS — B379 Candidiasis, unspecified: Secondary | ICD-10-CM

## 2022-10-30 MED ORDER — FLUCONAZOLE 150 MG PO TABS: ORAL_TABLET | ORAL | 0 refills | Status: DC

## 2023-11-05 ENCOUNTER — Ambulatory Visit: Payer: BC Managed Care – PPO | Admitting: Obstetrics and Gynecology

## 2023-11-11 ENCOUNTER — Ambulatory Visit: Admitting: Obstetrics and Gynecology

## 2023-11-11 NOTE — Progress Notes (Deleted)
 33 y.o. [id number] female on COC with prior mammoplasty here for annual exam. Married. Son. Patient's mother had breat cancer at 60yo.  No LMP recorded. (Menstrual status: Oral contraceptives).    Abnormal bleeding: *** Pelvic discharge or pain: *** Breast mass, nipple discharge or skin changes : *** Birth control: *** Last PAP:     Component Value Date/Time   DIAGPAP  10/23/2022 1641    - Negative for intraepithelial lesion or malignancy (NILM)   DIAGPAP  10/22/2021 1639    - Negative for intraepithelial lesion or malignancy (NILM)   DIAGPAP  04/14/2017 0000    NEGATIVE FOR INTRAEPITHELIAL LESIONS OR MALIGNANCY.   DIAGPAP  04/14/2017 0000    FUNGAL ORGANISMS PRESENT CONSISTENT WITH CANDIDA SPP.   HPVHIGH Negative 10/23/2022 1641   ADEQPAP  10/23/2022 1641    Satisfactory for evaluation; transformation zone component PRESENT.   ADEQPAP  10/22/2021 1639    Satisfactory for evaluation; transformation zone component PRESENT.   ADEQPAP  04/14/2017 0000    Satisfactory for evaluation  endocervical/transformation zone component PRESENT.   Gardasil: *** Sexually active: ***  Exercising: ***  GYN HISTORY: ***  OB History  Gravida Para Term Preterm AB Living  2 1 0 1 1 1   SAB IAB Ectopic Multiple Live Births   1   1    # Outcome Date GA Lbr Len/2nd Weight Sex Type Anes PTL Lv  2 Preterm 08/16/13 [redacted]w[redacted]d / 00:11 3 lb 3.5 oz (1.46 kg) M Vag-Spont EPI  LIV  1 IAB 2012 [redacted]w[redacted]d            Birth Comments: surgical Ab in [place]    Past Medical History:  Diagnosis Date   Family history of breast cancer    Family history of prostate cancer    Medical history non-contributory     Past Surgical History:  Procedure Laterality Date   BREAST BIOPSY Right 07/15/2018   x's 2   NO PAST SURGERIES     PLACEMENT OF BREAST IMPLANTS     2023    Current Outpatient Medications on File Prior to Visit  Medication Sig Dispense Refill   fluconazole (DIFLUCAN) 150 MG tablet Take one tab as one  dose then repeat q other day for 3 doses. 3 tablet 0   Multiple Vitamin (MULTIVITAMIN) tablet Take 1 tablet by mouth daily.     Norethindrone Acetate-Ethinyl Estrad-FE (JUNEL FE 24) 1-20 MG-MCG(24) tablet Take 1 tablet by mouth daily. 84 tablet 4   Prenatal Vit-Fe Fumarate-FA (PRENATAL VITAMIN PO) Take by mouth.     No current facility-administered medications on file prior to visit.    Social History   Socioeconomic History   Marital status: Married    Spouse name: Not on file   Number of children: Not on file   Years of education: Not on file   Highest education level: Not on file  Occupational History   Not on file  Tobacco Use   Smoking status: Never   Smokeless tobacco: Never  Vaping Use   Vaping status: Never Used  Substance and Sexual Activity   Alcohol use: Yes    Comment: occassionally   Drug use: No   Sexual activity: Yes    Partners: Male    Birth control/protection: OCP    Comment: 1st intercourse- 75, partners- 5  Other Topics Concern   Not on file  Social History Narrative   Not on file   Social Drivers of Corporate investment banker  Strain: Not on file  Food Insecurity: Not on file  Transportation Needs: Not on file  Physical Activity: Sufficiently Active (06/13/2019)   Exercise Vital Sign    Days of Exercise per Week: 5 days    Minutes of Exercise per Session: 80 min  Stress: No Stress Concern Present (06/13/2019)   [employer] of Occupational Health - Occupational Stress Questionnaire    Feeling of Stress : Only a little  Social Connections: Somewhat Isolated (06/13/2019)   Social Connection and Isolation Panel [NHANES]    Frequency of Communication with Friends and Family: More than three times a week    Frequency of Social Gatherings with Friends and Family: Once a week    Attends Religious Services: Never    Database administrator or Organizations: No    Attends Banker Meetings: Never    Marital Status: Married  Careers information officer Violence: Not At Risk (06/13/2019)   Humiliation, Afraid, Rape, and Kick questionnaire    Fear of Current or Ex-Partner: No    Emotionally Abused: No    Physically Abused: No    Sexually Abused: No    Family History  Problem Relation Age of Onset   Cancer Father        liver   Migraines Mother    Breast cancer Mother 35   Prostate cancer Maternal Uncle 98   Cancer Paternal Grandmother        NOS    No Known Allergies    PE There were no vitals filed for this visit. There is no height or weight on file to calculate BMI.  Physical Exam    Assessment and Plan:        There are no diagnoses linked to this encounter.  Rosalyn Gess, MD

## 2023-11-20 ENCOUNTER — Other Ambulatory Visit: Payer: Self-pay | Admitting: Obstetrics & Gynecology

## 2023-11-20 DIAGNOSIS — Z3041 Encounter for surveillance of contraceptive pills: Secondary | ICD-10-CM

## 2023-11-20 NOTE — Telephone Encounter (Signed)
 Med refill request: Junel FE 24 Last AEX: 10/23/22 Dr. Seymour Bars Next AEX: 04/15 25 Dr. Kennith Center Last MMG (if hormonal med) n/a Refill authorized: Junel FE 24  Please approve or deny as appropriate.

## 2023-12-08 ENCOUNTER — Encounter: Payer: Self-pay | Admitting: Obstetrics and Gynecology

## 2023-12-08 ENCOUNTER — Ambulatory Visit (INDEPENDENT_AMBULATORY_CARE_PROVIDER_SITE_OTHER): Admitting: Obstetrics and Gynecology

## 2023-12-08 VITALS — BP 102/64 | HR 112 | Temp 98.1°F | Ht 69.25 in | Wt 180.0 lb

## 2023-12-08 DIAGNOSIS — Z01419 Encounter for gynecological examination (general) (routine) without abnormal findings: Secondary | ICD-10-CM | POA: Insufficient documentation

## 2023-12-08 DIAGNOSIS — Z1331 Encounter for screening for depression: Secondary | ICD-10-CM

## 2023-12-08 DIAGNOSIS — Z3044 Encounter for surveillance of vaginal ring hormonal contraceptive device: Secondary | ICD-10-CM

## 2023-12-08 DIAGNOSIS — Z3041 Encounter for surveillance of contraceptive pills: Secondary | ICD-10-CM | POA: Insufficient documentation

## 2023-12-08 MED ORDER — ANNOVERA 0.013-0.15 MG/24HR VA RING: VAGINAL_RING | VAGINAL | 0 refills | Status: AC

## 2023-12-08 NOTE — Patient Instructions (Signed)

## 2023-12-08 NOTE — Progress Notes (Signed)
 33 y.o. [id number] female on COC with prior mammoplasty &breast implants, fibroadenomas (right breast, bx-proven, 2019) here for annual exam. Married. Son. Patient's mother had breat cancer at 60yo. Patient had negative genetic testing June 2019.  Had labs with health clinic. Notes breast tenderness with periods.  Patient's last menstrual period was 11/24/2023 (approximate). Period Duration (Days): 4 Period Pattern: Regular Menstrual Flow: Moderate Menstrual Control: Tampon Dysmenorrhea: (!) Moderate Dysmenorrhea Symptoms: Headache, Nausea  Abnormal bleeding: none Pelvic discharge or pain: none Breast mass, nipple discharge or skin changes : none Birth control: COC Last PAP:     Component Value Date/Time   DIAGPAP  10/23/2022 1641    - Negative for intraepithelial lesion or malignancy (NILM)   DIAGPAP  10/22/2021 1639    - Negative for intraepithelial lesion or malignancy (NILM)   DIAGPAP  04/14/2017 0000    NEGATIVE FOR INTRAEPITHELIAL LESIONS OR MALIGNANCY.   DIAGPAP  04/14/2017 0000    FUNGAL ORGANISMS PRESENT CONSISTENT WITH CANDIDA SPP.   HPVHIGH Negative 10/23/2022 1641   ADEQPAP  10/23/2022 1641    Satisfactory for evaluation; transformation zone component PRESENT.   ADEQPAP  10/22/2021 1639    Satisfactory for evaluation; transformation zone component PRESENT.   ADEQPAP  04/14/2017 0000    Satisfactory for evaluation  endocervical/transformation zone component PRESENT.   Gardasil: unknown Sexually active: yes  Exercising: Yes. Gym/ health club routine includes mod to heavy weightlifting.  Smoker: No  Flowsheet Row Office Visit from 12/08/2023 in [facility]  PHQ-2 Total Score 2        GYN HISTORY: No significant history  OB History  Gravida Para Term Preterm AB Living  2 1 0 1 1 1   SAB IAB Ectopic Multiple Live Births   1   1    # Outcome Date GA Lbr Len/2nd Weight Sex Type Anes PTL Lv  2 Preterm 08/16/13 [redacted]w[redacted]d / 00:11 3  lb 3.5 oz (1.46 kg) M Vag-Spont EPI  LIV  1 IAB 2012 [redacted]w[redacted]d            Birth Comments: surgical Ab in [place]    Past Medical History:  Diagnosis Date   Family history of breast cancer    Family history of prostate cancer    Medical history non-contributory    Premature rupture of membranes in pregnancy, antepartum 08/12/2013    Past Surgical History:  Procedure Laterality Date   BREAST BIOPSY Right 07/15/2018   x's 2   NO PAST SURGERIES     PLACEMENT OF BREAST IMPLANTS     2023    Current Outpatient Medications on File Prior to Visit  Medication Sig Dispense Refill   Multiple Vitamin (MULTIVITAMIN) tablet Take 1 tablet by mouth daily.     Prenatal Vit-Fe Fumarate-FA (PRENATAL VITAMIN PO) Take by mouth.     No current facility-administered medications on file prior to visit.    Social History   Socioeconomic History   Marital status: Married    Spouse name: Not on file   Number of children: Not on file   Years of education: Not on file   Highest education level: Not on file  Occupational History   Not on file  Tobacco Use   Smoking status: Never   Smokeless tobacco: Never  Vaping Use   Vaping status: Never Used  Substance and Sexual Activity   Alcohol use: Yes    Comment: occassionally   Drug use: No   Sexual activity: Yes  Partners: Male    Birth control/protection: OCP    Comment: 1st intercourse- 17, partners- 5  Other Topics Concern   Not on file  Social History Narrative   Not on file   Social Drivers of Health   Financial Resource Strain: Not on file  Food Insecurity: Not on file  Transportation Needs: Not on file  Physical Activity: Sufficiently Active (06/13/2019)   Exercise Vital Sign    Days of Exercise per Week: 5 days    Minutes of Exercise per Session: 80 min  Stress: No Stress Concern Present (06/13/2019)   [employer] of Occupational Health - Occupational Stress Questionnaire    Feeling of Stress : Only a little  Social  Connections: Somewhat Isolated (06/13/2019)   Social Connection and Isolation Panel [NHANES]    Frequency of Communication with Friends and Family: More than three times a week    Frequency of Social Gatherings with Friends and Family: Once a week    Attends Religious Services: Never    Database administrator or Organizations: No    Attends Banker Meetings: Never    Marital Status: Married  Catering manager Violence: Not At Risk (06/13/2019)   Humiliation, Afraid, Rape, and Kick questionnaire    Fear of Current or Ex-Partner: No    Emotionally Abused: No    Physically Abused: No    Sexually Abused: No    Family History  Problem Relation Age of Onset   Cancer Father        liver   Migraines Mother    Breast cancer Mother 81   Prostate cancer Maternal Uncle 103   Cancer Paternal Grandmother        NOS    No Known Allergies    PE Today's Vitals   12/08/23 0839  BP: 102/64  Pulse: (!) 112  Temp: 98.1 F (36.7 C)  TempSrc: Oral  SpO2: 97%  Weight: 180 lb (81.6 kg)  Height: 5' 9.25" (1.759 m)   Body mass index is 26.39 kg/m.  Physical Exam Vitals reviewed. Exam conducted with a chaperone present.  Constitutional:      General: She is not in acute distress.    Appearance: Normal appearance.  HENT:     Head: Normocephalic and atraumatic.     Nose: Nose normal.  Eyes:     Extraocular Movements: Extraocular movements intact.     Conjunctiva/sclera: Conjunctivae normal.  Neck:     Thyroid: No thyroid mass, thyromegaly or thyroid tenderness.  Pulmonary:     Effort: Pulmonary effort is normal.  Chest:     Chest wall: No mass or tenderness.  Breasts:    Right: Normal. No swelling, mass, nipple discharge, skin change or tenderness.     Left: Normal. No swelling, mass, nipple discharge, skin change or tenderness.     Comments: BL mammoplasty scars and implants Abdominal:     General: There is no distension.     Palpations: Abdomen is soft.      Tenderness: There is no abdominal tenderness.  Genitourinary:    General: Normal vulva.     Exam position: Lithotomy position.     Urethra: No prolapse.     Vagina: Normal. No vaginal discharge or bleeding.     Cervix: Normal. No lesion.     Uterus: Normal. Not enlarged and not tender.      Adnexa: Right adnexa normal and left adnexa normal.  Musculoskeletal:        General: Normal range  of motion.     Cervical back: Normal range of motion.  Lymphadenopathy:     Upper Body:     Right upper body: No axillary adenopathy.     Left upper body: No axillary adenopathy.     Lower Body: No right inguinal adenopathy. No left inguinal adenopathy.  Skin:    General: Skin is warm and dry.  Neurological:     General: No focal deficit present.     Mental Status: She is alert.  Psychiatric:        Mood and Affect: Mood normal.        Behavior: Behavior normal.      Assessment and Plan:        Well woman exam with routine gynecological exam Assessment & Plan: Cervical cancer screening performed according to ASCCP guidelines. Labs and immunizations with her primary Encouraged safe sexual practices as indicated Encouraged healthy lifestyle practices with diet and exercise For patients under 50yo, I recommend 1000mg  calcium daily and 600IU of vitamin D daily.  Encounter for surveillance of vaginal ring hormonal contraceptive device Patient sometimes forgetting the pill.  Discussed alternative options for combined contraceptives including patch and vaginal ring.  Patient opted to try the vaginal ring. -     [place]a; Insert ring into the vagina for 21 days. Remove for 7 days to allow for menses. Store ring in case during this time. Reinsert after 7 days. Repeat instructions for 1 year.  Dispense: 1 each; Refill: 0  Negative depression screening   Romaine Closs, MD

## 2023-12-08 NOTE — Assessment & Plan Note (Signed)
 Cervical cancer screening performed according to ASCCP guidelines. Labs and immunizations with her primary Encouraged safe sexual practices as indicated Encouraged healthy lifestyle practices with diet and exercise For patients under 35yo, I recommend 1000mg  calcium daily and 600IU of vitamin D daily.

## 2023-12-16 DIAGNOSIS — I781 Nevus, non-neoplastic: Secondary | ICD-10-CM | POA: Diagnosis not present

## 2023-12-22 IMAGING — MG MM DIGITAL DIAGNOSTIC UNILAT*R* W/ TOMO W/ CAD
4 series · 4 of 12 positions shown · non-contrast
Comparison: Previous exam(s).

CLINICAL DATA: Possible distortion in the posterior outer right
breast on a recent screening mammogram.

EXAM:
DIGITAL DIAGNOSTIC UNILATERAL RIGHT MAMMOGRAM WITH TOMOSYNTHESIS AND
CAD
TECHNIQUE: Right digital diagnostic mammography and breast tomosynthesis was
performed. The images were evaluated with computer-aided detection.

[R ML synth-2D]
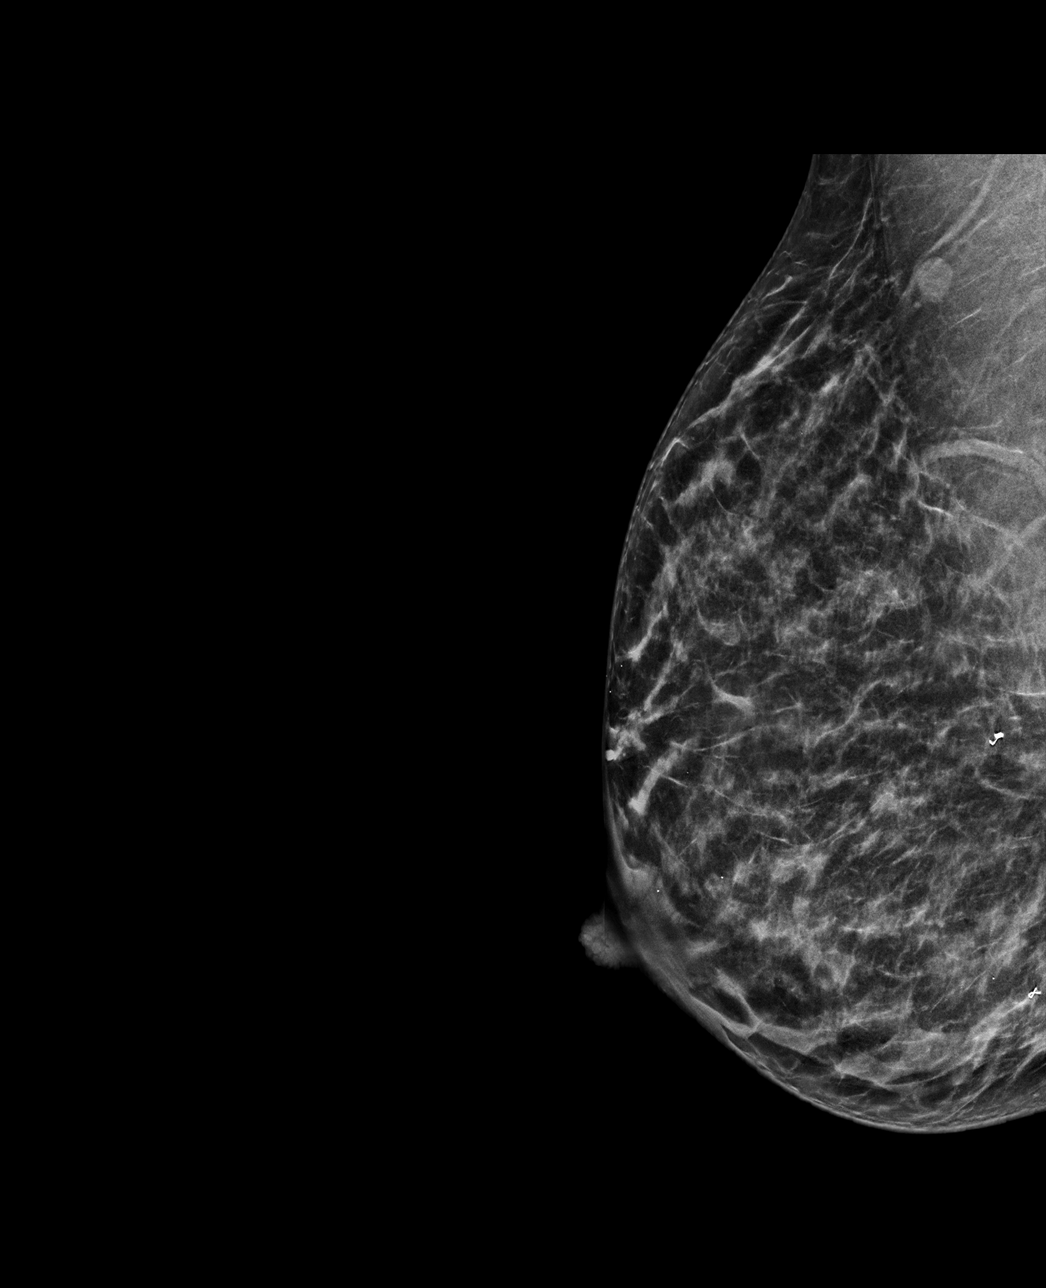

[R CC synth-2D]
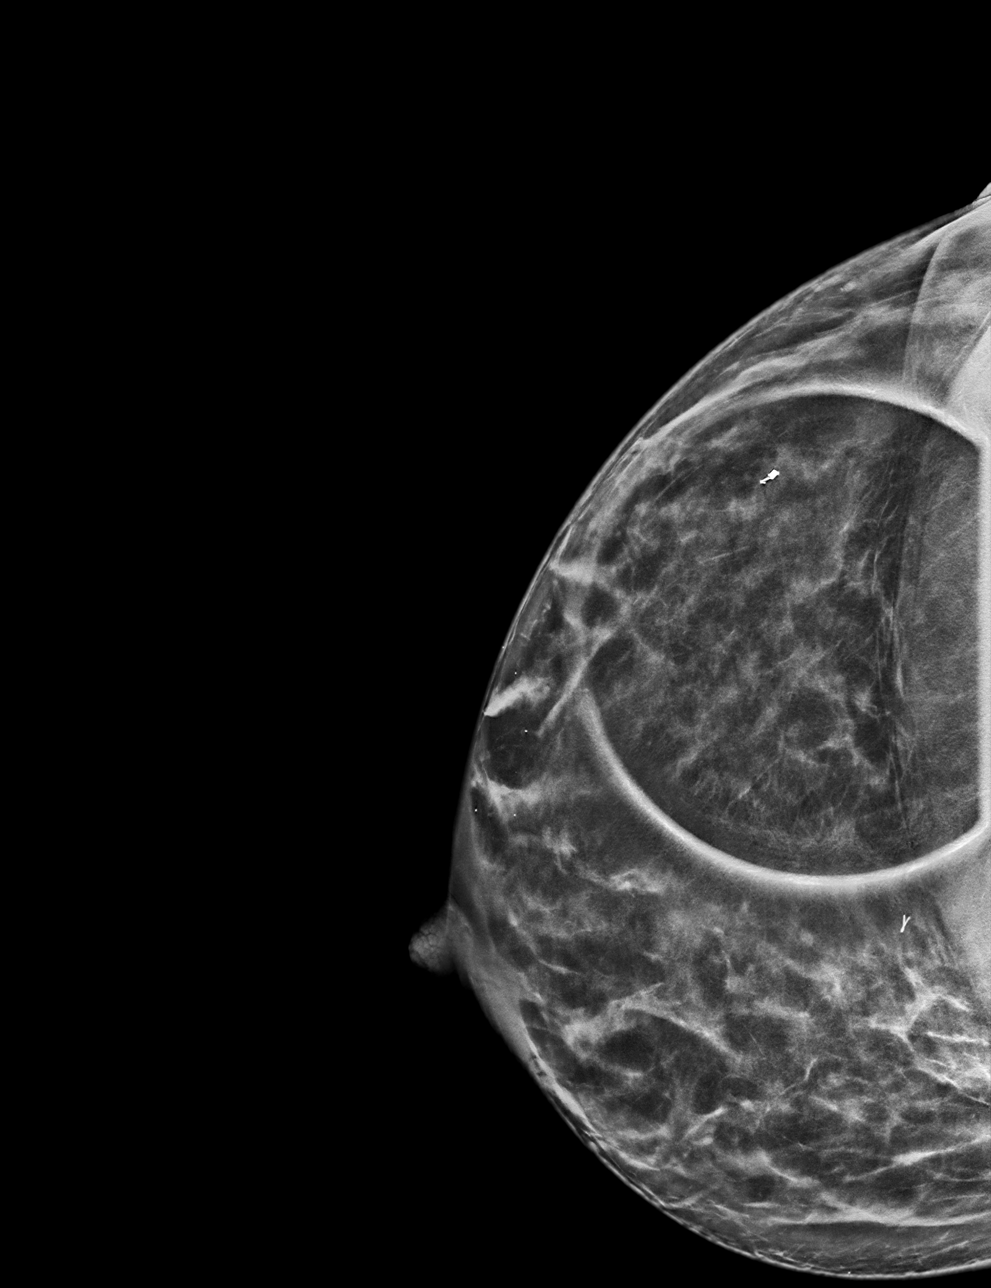

[R CC tomo · tomo slice 35/69.0]
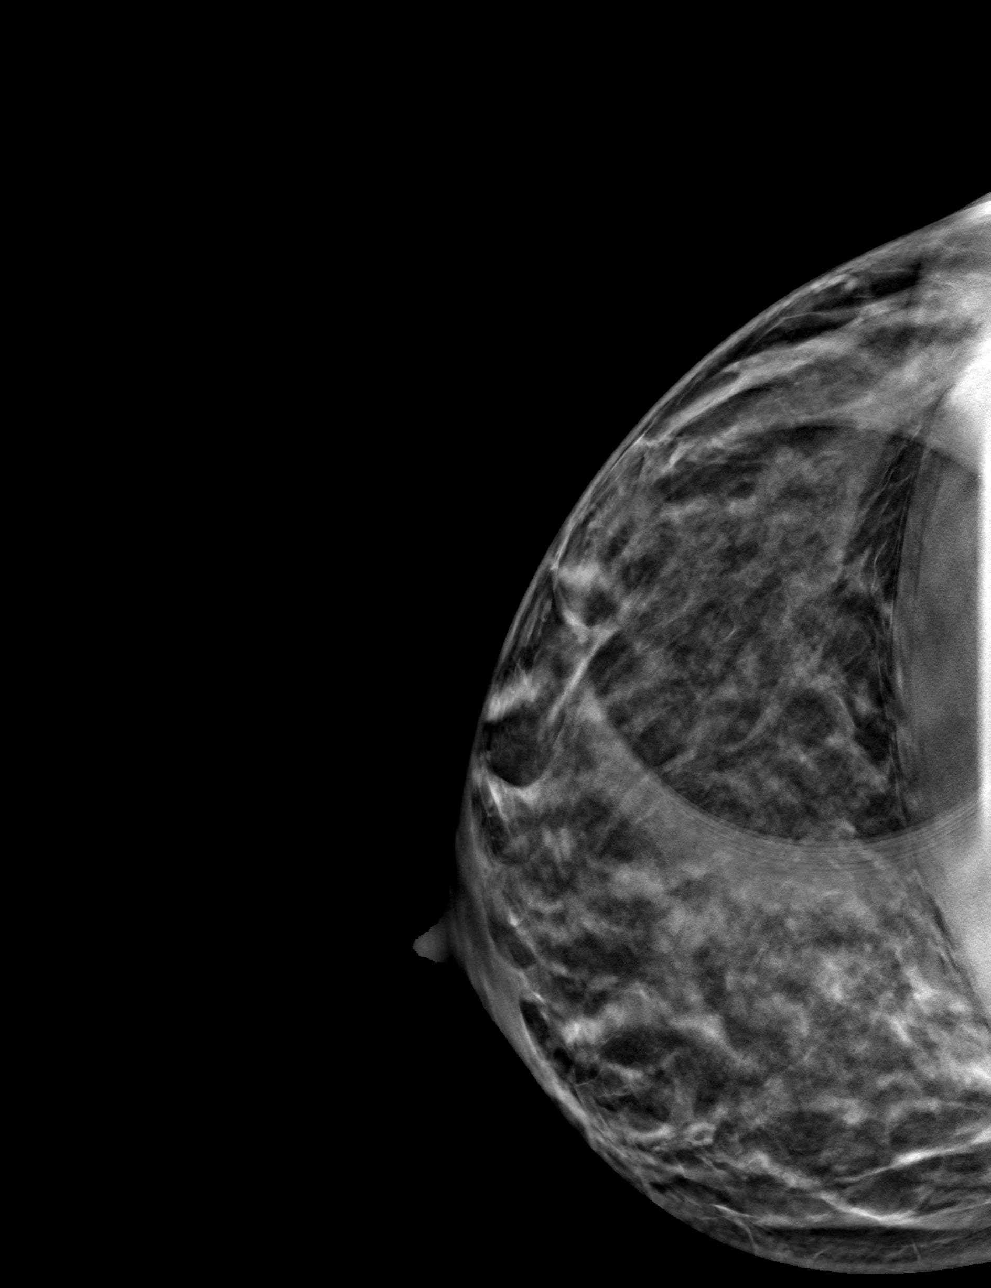

[R ML tomo · tomo slice 31/60.0]
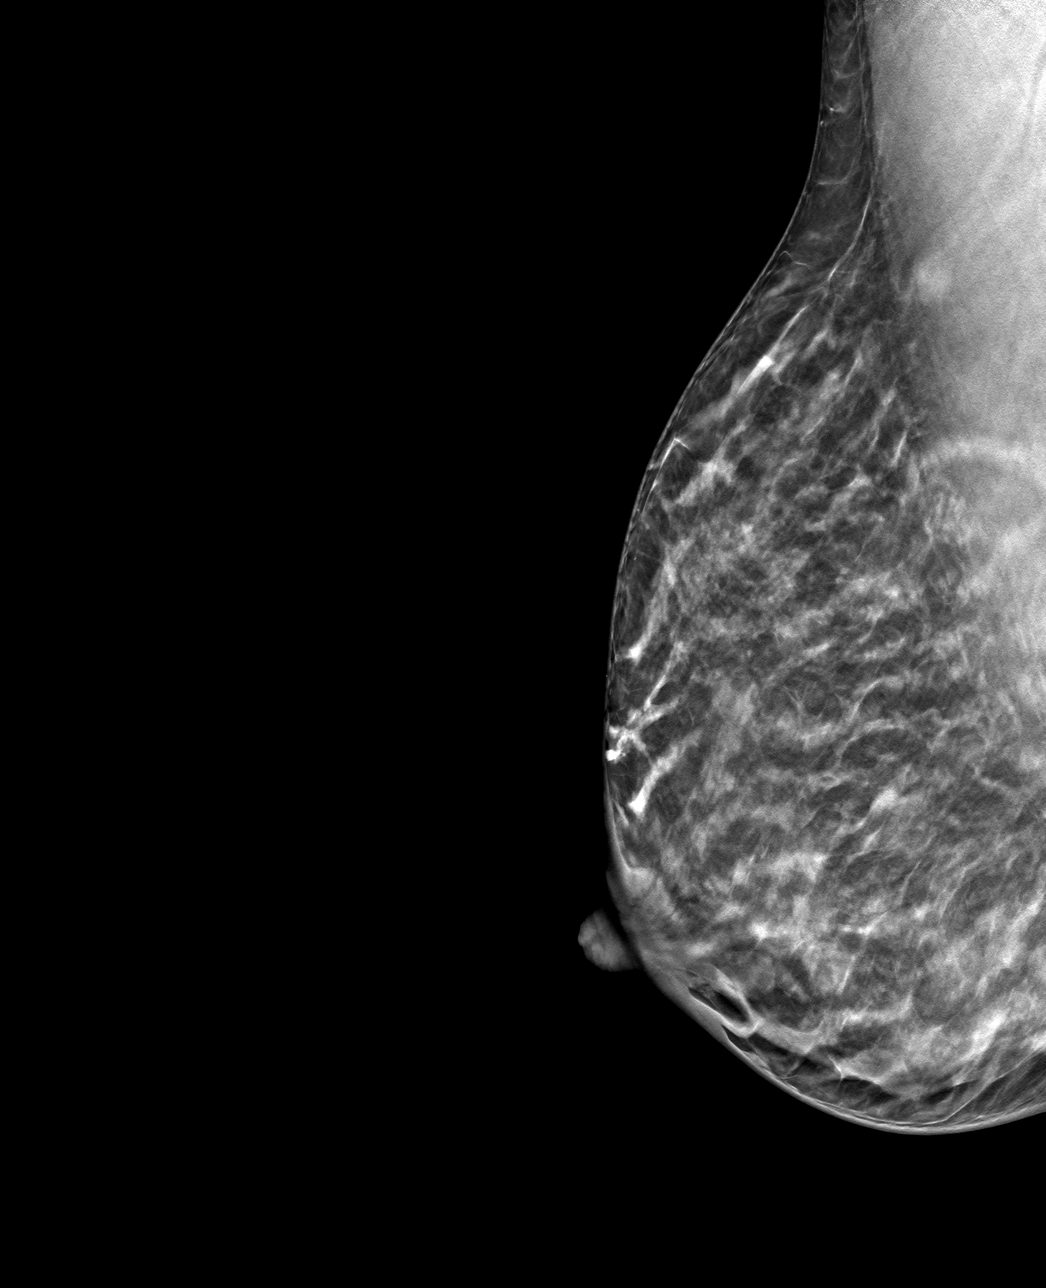

[4 of 12 positions shown; findings below may reference images not displayed]

ACR Breast Density Category c: The breast tissue is heterogeneously
dense, which may obscure small masses.
FINDINGS: 3D tomographic and 2D generated true lateral and spot compression
craniocaudal images of the right breast demonstrate normal appearing
fibroglandular tissue and supporting ligaments at the location of
the recently suspected distortion, unchanged compared previous
examinations.
IMPRESSION: No evidence of malignancy. The recently suspected right breast
asymmetry was close apposition of normal breast tissue.

RECOMMENDATION:
Bilateral screening mammogram in 1 year when due.

I have discussed the findings and recommendations with the patient.
If applicable, a reminder letter will be sent to the patient
regarding the next appointment.

BI-RADS CATEGORY  1: Negative.

## 2024-08-02 NOTE — Progress Notes (Signed)
 Surgery orders requested via Epic inbox.

## 2024-08-02 NOTE — Patient Instructions (Addendum)
 SURGICAL WAITING ROOM VISITATION Patients having surgery or a procedure may have no more than 2 support people in the waiting area - these visitors may rotate.    Children under the age of 48 will not be allowed to visit due to the increase in respiratory illness  Children under the age of 66 must have an adult with them who is not the patient.  If the patient needs to stay at the hospital during part of their recovery, the visitor guidelines for inpatient rooms apply. Pre-op nurse will coordinate an appropriate time for 1 support person to accompany patient in pre-op.  This support person may not rotate.    Please refer to the [facility] website for the visitor guidelines for Inpatients (after your surgery is over and you are in a regular room).       Your procedure is scheduled on: 09-08-24   Report to [facility] Main Entrance    Report to admitting at 12:30 PM   Call this number if you have problems the morning of surgery [phone]   Do not eat food :After Midnight.   After Midnight you may have the following liquids until 11:50 AM DAY OF SURGERY  Water Non-Citrus Juices (without pulp, NO RED-Apple, White grape, White cranberry) Black Coffee (NO MILK/CREAM OR CREAMERS, sugar ok)  Clear Tea (NO MILK/CREAM OR CREAMERS, sugar ok) regular and decaf                             Plain Jell-O (NO RED)                                           Fruit ices (not with fruit pulp, NO RED)                                     Popsicles (NO RED)                                                               Sports drinks like Gatorade (NO RED)                   The day of surgery:  Drink ONE (1) Pre-Surgery Clear Ensure or G2 by 11:50 AM the morning of surgery. Drink in one sitting. Do not sip.  This drink was given to you during your hospital  pre-op appointment visit. Nothing else to drink after completing the Pre-Surgery Clear Ensure or G2.          If you have questions,  please contact your surgeon's office.   FOLLOW  ANY ADDITIONAL PRE OP INSTRUCTIONS YOU RECEIVED FROM YOUR SURGEON'S OFFICE!!!     Oral Hygiene is also important to reduce your risk of infection.                                    Remember - BRUSH YOUR TEETH THE MORNING OF SURGERY WITH YOUR REGULAR TOOTHPASTE   Do NOT  smoke after Midnight   Take these medicines the morning of surgery with A SIP OF WATER:    None  Stop all vitamins and herbal supplements 7 days before surgery                              You may not have any metal on your body including hair pins, jewelry, and body piercing             Do not wear make-up, lotions, powders, perfumes, or deodorant  Do not wear nail polish including gel and S&S, artificial/acrylic nails, or any other type of covering on natural nails including finger and toenails. If you have artificial nails, gel coating, etc. that needs to be removed by a nail salon please have this removed prior to surgery or surgery may need to be canceled/ delayed if the surgeon/ anesthesia feels like they are unable to be safely monitored.   Do not shave  48 hours prior to surgery.    Do not bring valuables to the hospital. [place] IS NOT RESPONSIBLE   FOR VALUABLES.   Contacts, dentures or bridgework may not be worn into surgery.  DO NOT BRING YOUR HOME MEDICATIONS TO THE HOSPITAL. PHARMACY WILL DISPENSE MEDICATIONS LISTED ON YOUR MEDICATION LIST TO YOU DURING YOUR ADMISSION IN THE HOSPITAL!    Patients discharged on the day of surgery will not be allowed to drive home.  Someone NEEDS to stay with you for the first 24 hours after anesthesia.   Special Instructions: Bring a copy of your healthcare power of attorney and living will documents the day of surgery if you haven't scanned them before.              Please read over the following fact sheets you were given: IF YOU HAVE QUESTIONS ABOUT YOUR PRE-OP INSTRUCTIONS PLEASE CALL [phone] [place]  If you  received a COVID test during your pre-op visit  it is requested that you wear a mask when out in public, stay away from anyone that may not be feeling well and notify your surgeon if you develop symptoms. If you test positive for Covid or have been in contact with anyone that has tested positive in the last 10 days please notify you surgeon.  [facility] - Preparing for Surgery Before surgery, you can play an important role.  Because skin is not sterile, your skin needs to be as free of germs as possible.  You can reduce the number of germs on your skin by washing with CHG (chlorahexidine gluconate) soap before surgery.  CHG is an antiseptic cleaner which kills germs and bonds with the skin to continue killing germs even after washing. Please DO NOT use if you have an allergy to CHG or antibacterial soaps.  If your skin becomes reddened/irritated stop using the CHG and inform your nurse when you arrive at Short Stay. Do not shave (including legs and underarms) for at least 48 hours prior to the first CHG shower.  You may shave your face/neck.  Please follow these instructions carefully:  1.  Shower with CHG Soap the night before surgery ONLY (DO NOT USE THE SOAP THE MORNING OF SURGERY).  2.  If you choose to wash your hair, wash your hair first as usual with your normal  shampoo.  3.  After you shampoo, rinse your hair and body thoroughly to remove the shampoo.  4.  Use CHG as you would any other liquid soap.  You can apply chg directly to the skin and wash.  Gently with a scrungie or clean washcloth.  5.  Apply the CHG Soap to your body ONLY FROM THE NECK DOWN.   Do   not use on face/ open                           Wound or open sores. Avoid contact with eyes, ears mouth and   genitals (private parts).                       Wash face,  Genitals (private parts) with your normal soap.             6.  Wash thoroughly, paying special attention to the area where your    surgery   will be performed.  7.  Thoroughly rinse your body with warm water from the neck down.  8.  DO NOT shower/wash with your normal soap after using and rinsing off the CHG Soap.                9.  Pat yourself dry with a clean towel.            10.  Wear clean pajamas.            11.  Place clean sheets on your bed the night of your first shower and do not  sleep with pets. Day of Surgery : Do not apply any CHG, lotions/deodorants the morning of surgery.  Please wear clean clothes to the hospital/surgery center.  FAILURE TO FOLLOW THESE INSTRUCTIONS MAY RESULT IN THE CANCELLATION OF YOUR SURGERY  PATIENT SIGNATURE_________________________________  NURSE SIGNATURE__________________________________  ________________________________________________________________________

## 2024-08-03 ENCOUNTER — Ambulatory Visit: Payer: Self-pay | Admitting: Surgery

## 2024-08-03 DIAGNOSIS — Z01818 Encounter for other preprocedural examination: Secondary | ICD-10-CM

## 2024-08-05 ENCOUNTER — Encounter (HOSPITAL_COMMUNITY): Payer: Self-pay

## 2024-08-05 ENCOUNTER — Encounter (HOSPITAL_COMMUNITY)
Admission: RE | Admit: 2024-08-05 | Discharge: 2024-08-05 | Disposition: A | Source: Ambulatory Visit | Attending: Anesthesiology

## 2024-08-05 DIAGNOSIS — Z01818 Encounter for other preprocedural examination: Secondary | ICD-10-CM

## 2024-09-01 NOTE — Patient Instructions (Signed)
 SURGICAL WAITING ROOM VISITATION Patients having surgery or a procedure may have no more than 2 support people in the waiting area - these visitors may rotate in the visitor waiting room.   Due to an increase in RSV and influenza rates and associated hospitalizations, children ages 16 and under may not visit patients in [facility] hospitals. If the patient needs to stay at the hospital during part of their recovery, the visitor guidelines for inpatient rooms apply.  PRE-OP VISITATION  Pre-op nurse will coordinate an appropriate time for 1 support person to accompany the patient in pre-op.  This support person may not rotate.  This visitor will be contacted when the time is appropriate for the visitor to come back in the pre-op area.  Please refer to the [facility] website for the visitor guidelines for Inpatients (after your surgery is over and you are in a regular room).  You are not required to quarantine at this time prior to your surgery. However, you must do this: Hand Hygiene often Do NOT share personal items Notify your provider if you are in close contact with someone who has COVID or you develop fever 100.4 or greater, new onset of sneezing, cough, sore throat, shortness of breath or body aches.  If you test positive for Covid or have been in contact with anyone that has tested positive in the last 10 days please notify you surgeon.    Your procedure is scheduled on: 09/08/24   Report to [facility] Main Entrance: [place] entrance where the [employer] is available.   Report to admitting at: 12:35 PM  Call this number if you have any questions or problems the morning of surgery [phone]  FOLLOW ANY ADDITIONAL PRE OP INSTRUCTIONS YOU RECEIVED FROM YOUR SURGEON'S OFFICE!!!  Do not eat food after Midnight the night prior to your surgery/procedure.  After Midnight you may have the following liquids until: 8:30 AM DAY OF SURGERY  Clear Liquid Diet Water  Black  Coffee (sugar ok, NO MILK/CREAM OR CREAMERS)  Tea (sugar ok, NO MILK/CREAM OR CREAMERS) regular and decaf                             Plain Jell-O  with no fruit (NO RED)                                           Fruit ices (not with fruit pulp, NO RED)                                     Popsicles (NO RED)                                                                  Juice: NO CITRUS JUICES: only apple, WHITE grape, WHITE cranberry Sports drinks like Gatorade or Powerade (NO RED)   Oral Hygiene is also important to reduce your risk of infection.        Remember - BRUSH YOUR TEETH THE MORNING OF SURGERY WITH YOUR REGULAR TOOTHPASTE  Do NOT smoke after Midnight the night before surgery.  STOP TAKING all Vitamins, Herbs and supplements 1 week before your surgery.   Take ONLY these medicines the morning of surgery with A SIP OF WATER : NONE                   You may not have any metal on your body including hair pins, jewelry, and body piercing  Do not wear make-up, lotions, powders, perfumes / cologne, or deodorant  Do not wear nail polish including gel and S&S, artificial / acrylic nails, or any other type of covering on natural nails including finger and toenails. If you have artificial nails, gel coating, etc., that needs to be removed by a nail salon, Please have this removed prior to surgery. Not doing so may mean that your surgery could be cancelled or delayed if the Surgeon or anesthesia staff feels like they are unable to monitor you safely.   Do not shave 48 hours prior to surgery to avoid nicks in your skin which may contribute to postoperative infections.   Contacts, Hearing Aids, dentures or bridgework may not be worn into surgery. DENTURES WILL BE REMOVED PRIOR TO SURGERY PLEASE DO NOT APPLY Poly grip OR ADHESIVES!!!  You may bring a small overnight bag with you on the day of surgery, only pack items that are not valuable. [place] IS NOT RESPONSIBLE   FOR VALUABLES THAT  ARE LOST OR STOLEN.   Patients discharged on the day of surgery will not be allowed to drive home.  Someone NEEDS to stay with you for the first 24 hours after anesthesia.  Do not bring your home medications to the hospital. The Pharmacy will dispense medications listed on your medication list to you during your admission in the Hospital.  Special Instructions: Bring a copy of your healthcare power of attorney and living will documents the day of surgery, if you wish to have them scanned into your [place] Medical Records- EPIC  Please read over the following fact sheets you were given: IF YOU HAVE QUESTIONS ABOUT YOUR PRE-OP INSTRUCTIONS, PLEASE CALL [phone]   [facility] - Preparing for Surgery      Before surgery, you can play an important role.  Because skin is not sterile, your skin needs to be as free of germs as possible.  You can reduce the number of germs on your skin by washing with CHG (chlorahexidine gluconate) soap before surgery.  CHG is an antiseptic cleaner which kills germs and bonds with the skin to continue killing germs even after washing. Please DO NOT use if you have an allergy to CHG or antibacterial soaps.  If your skin becomes reddened/irritated stop using the CHG and inform your nurse when you arrive at Short Stay. Do not shave (including legs and underarms) for at least 48 hours prior to the first CHG shower.  You may shave your face/neck.  Please follow these instructions carefully:  1.  Shower with CHG Soap the night before surgery ONLY (DO NOT USE THE SOAP THE MORNING OF SURGERY).  2.  If you choose to wash your hair, wash your hair first as usual with your normal  shampoo.  3.  After you shampoo, rinse your hair and body thoroughly to remove the shampoo.                             4.  Use CHG as you would  any other liquid soap.  You can apply chg directly to the skin and wash.  Gently with a scrungie or clean washcloth.  5.  Apply the CHG Soap to your body  ONLY FROM THE NECK DOWN.   Do not use on face/ open                           Wound or open sores. Avoid contact with eyes, ears mouth and genitals (private parts).                       Wash face,  Genitals (private parts) with your normal soap.             6.  Wash thoroughly, paying special attention to the area where your  surgery  will be performed.  7.  Thoroughly rinse your body with warm water  from the neck down.  8.  DO NOT shower/wash with your normal soap after using and rinsing off the CHG Soap.                9.  Pat yourself dry with a clean towel.            10.  Wear clean pajamas.            11.  Place clean sheets on your bed the night of your first shower and do not  sleep with pets.  Day of Surgery : Do not apply any CHG, lotions/deodorants the morning of surgery.  Please wear clean clothes to the hospital/surgery center.   FAILURE TO FOLLOW THESE INSTRUCTIONS MAY RESULT IN THE CANCELLATION OF YOUR SURGERY  PATIENT SIGNATURE_________________________________  NURSE SIGNATURE__________________________________  ________________________________________________________________________

## 2024-09-02 ENCOUNTER — Encounter (HOSPITAL_COMMUNITY): Payer: Self-pay

## 2024-09-02 ENCOUNTER — Encounter (HOSPITAL_COMMUNITY)
Admission: RE | Admit: 2024-09-02 | Discharge: 2024-09-02 | Disposition: A | Source: Ambulatory Visit | Attending: Surgery

## 2024-09-02 ENCOUNTER — Other Ambulatory Visit: Payer: Self-pay

## 2024-09-02 DIAGNOSIS — Z01812 Encounter for preprocedural laboratory examination: Secondary | ICD-10-CM | POA: Insufficient documentation

## 2024-09-02 DIAGNOSIS — Z01818 Encounter for other preprocedural examination: Secondary | ICD-10-CM | POA: Diagnosis present

## 2024-09-02 HISTORY — DX: Anemia, unspecified: D64.9

## 2024-09-02 LAB — CBC WITH DIFFERENTIAL/PLATELET
Abs Immature Granulocytes: 0.01 K/uL (ref 0.00–0.07)
Basophils Absolute: 0.1 K/uL (ref 0.0–0.1)
Basophils Relative: 1 %
Eosinophils Absolute: 0.1 K/uL (ref 0.0–0.5)
Eosinophils Relative: 1 %
HCT: 39.8 % (ref 36.0–46.0)
Hemoglobin: 13.6 g/dL (ref 12.0–15.0)
Immature Granulocytes: 0 %
Lymphocytes Relative: 39 %
Lymphs Abs: 2 K/uL (ref 0.7–4.0)
MCH: 30.9 pg (ref 26.0–34.0)
MCHC: 34.2 g/dL (ref 30.0–36.0)
MCV: 90.5 fL (ref 80.0–100.0)
Monocytes Absolute: 0.3 K/uL (ref 0.1–1.0)
Monocytes Relative: 5 %
Neutro Abs: 2.8 K/uL (ref 1.7–7.7)
Neutrophils Relative %: 54 %
Platelets: 266 K/uL (ref 150–400)
RBC: 4.4 MIL/uL (ref 3.87–5.11)
RDW: 12.2 % (ref 11.5–15.5)
WBC: 5.2 K/uL (ref 4.0–10.5)
nRBC: 0 % (ref 0.0–0.2)

## 2024-09-02 LAB — BASIC METABOLIC PANEL WITH GFR
Anion gap: 8 (ref 5–15)
BUN: 14 mg/dL (ref 6–20)
CO2: 26 mmol/L (ref 22–32)
Calcium: 9.2 mg/dL (ref 8.9–10.3)
Chloride: 107 mmol/L (ref 98–111)
Creatinine, Ser: 0.85 mg/dL (ref 0.44–1.00)
GFR, Estimated: >60 mL/min
Glucose, Bld: 87 mg/dL (ref 70–99)
Potassium: 4 mmol/L (ref 3.5–5.1)
Sodium: 141 mmol/L (ref 135–145)

## 2024-09-02 NOTE — Progress Notes (Signed)
 For Anesthesia: PCP - Reggie Nian, Thora, NP  Cardiologist - N/A  Bowel Prep reminder:  Chest x-ray -  EKG -  Stress Test -  ECHO -  Cardiac Cath -  Pacemaker/ICD device last checked: Pacemaker orders received: Device Rep notified:  Spinal Cord Stimulator:N/A  Sleep Study - N/A CPAP -   Fasting Blood Sugar - N/A Checks Blood Sugar _____ times a day Date and result of last Hgb A1c-  Last dose of GLP1 agonist- N/A GLP1 instructions: Hold 7 days prior to schedule (Hold 24 hours-daily)   Last dose of SGLT-2 inhibitors- N/A SGLT-2 instructions: Hold 72 hours prior to surgery  Blood Thinner Instructions:N/A Last Dose: Time last taken:  Aspirin Instructions:N/A Last Dose: Time last taken:  Activity level: Can go up a flight of stairs and activities of daily living without stopping and without chest pain and/or shortness of breath   Able to exercise without chest pain and/or shortness of breath  Anesthesia review:   Patient denies shortness of breath, fever, cough and chest pain at PAT appointment   Patient verbalized understanding of instructions that were reviewed over the telephone.

## 2024-09-08 ENCOUNTER — Encounter (HOSPITAL_COMMUNITY): Payer: Self-pay | Admitting: Surgery

## 2024-09-08 ENCOUNTER — Other Ambulatory Visit: Payer: Self-pay

## 2024-09-08 ENCOUNTER — Ambulatory Visit (HOSPITAL_COMMUNITY): Admitting: Anesthesiology

## 2024-09-08 ENCOUNTER — Encounter (HOSPITAL_COMMUNITY): Admission: RE | Disposition: A | Payer: Self-pay | Source: Ambulatory Visit | Attending: Surgery

## 2024-09-08 ENCOUNTER — Ambulatory Visit (HOSPITAL_COMMUNITY)
Admission: RE | Admit: 2024-09-08 | Discharge: 2024-09-08 | Disposition: A | Discharge status: Home / Self Care | Source: Ambulatory Visit | Attending: Surgery | Admitting: Surgery

## 2024-09-08 DIAGNOSIS — Z01818 Encounter for other preprocedural examination: Secondary | ICD-10-CM

## 2024-09-08 DIAGNOSIS — L723 Sebaceous cyst: Secondary | ICD-10-CM | POA: Diagnosis present

## 2024-09-08 DIAGNOSIS — L0591 Pilonidal cyst without abscess: Secondary | ICD-10-CM | POA: Diagnosis not present

## 2024-09-08 HISTORY — PX: EXCISION OF BACK LESION: SHX6597

## 2024-09-08 LAB — POCT PREGNANCY, URINE: Preg Test, Ur: NEGATIVE

## 2024-09-08 MED ORDER — SODIUM CHLORIDE 0.9 % IV SOLN: INTRAVENOUS | Status: DC | PRN

## 2024-09-08 MED ORDER — DROPERIDOL 2.5 MG/ML IJ SOLN: 0.6250 mg | Freq: Once | INTRAMUSCULAR | Status: DC | PRN

## 2024-09-08 MED ORDER — 0.9 % SODIUM CHLORIDE (POUR BTL) OPTIME
TOPICAL | Status: DC | PRN
  Administered 2024-09-08: 1000 mL

## 2024-09-08 MED ORDER — ONDANSETRON HCL 4 MG/2ML IJ SOLN
INTRAMUSCULAR | Status: DC | PRN
  Administered 2024-09-08: 4 mg via INTRAVENOUS

## 2024-09-08 MED ORDER — ACETAMINOPHEN 10 MG/ML IV SOLN: 1000.0000 mg | Freq: Once | INTRAVENOUS | Status: DC | PRN

## 2024-09-08 MED ORDER — OXYCODONE HCL 5 MG PO TABS: 5.0000 mg | ORAL_TABLET | Freq: Once | ORAL | Status: DC | PRN

## 2024-09-08 MED ORDER — BUPIVACAINE-EPINEPHRINE (PF) 0.25% -1:200000 IJ SOLN
INTRAMUSCULAR | Status: DC | PRN
  Administered 2024-09-08: 7 mL via PERINEURAL

## 2024-09-08 MED ORDER — DEXAMETHASONE SOD PHOSPHATE PF 10 MG/ML IJ SOLN
INTRAMUSCULAR | Status: AC
  Filled 2024-09-08: qty 1

## 2024-09-08 MED ORDER — ONDANSETRON HCL 4 MG/2ML IJ SOLN
INTRAMUSCULAR | Status: AC
  Filled 2024-09-08: qty 2

## 2024-09-08 MED ORDER — FENTANYL CITRATE (PF) 100 MCG/2ML IJ SOLN
INTRAMUSCULAR | Status: AC
  Filled 2024-09-08: qty 2

## 2024-09-08 MED ORDER — OXYCODONE HCL 5 MG/5ML PO SOLN: 5.0000 mg | Freq: Once | ORAL | Status: DC | PRN

## 2024-09-08 MED ORDER — CEFAZOLIN SODIUM-DEXTROSE 2-3 GM-%(50ML) IV SOLR
INTRAVENOUS | Status: DC | PRN
  Administered 2024-09-08: 2 g via INTRAVENOUS

## 2024-09-08 MED ORDER — MIDAZOLAM HCL 2 MG/2ML IJ SOLN
INTRAMUSCULAR | Status: AC
  Filled 2024-09-08: qty 2

## 2024-09-08 MED ORDER — BUPIVACAINE-EPINEPHRINE (PF) 0.25% -1:200000 IJ SOLN
INTRAMUSCULAR | Status: AC
  Filled 2024-09-08: qty 30

## 2024-09-08 MED ORDER — FENTANYL CITRATE (PF) 100 MCG/2ML IJ SOLN
INTRAMUSCULAR | Status: DC | PRN
  Administered 2024-09-08: 100 ug via INTRAVENOUS

## 2024-09-08 MED ORDER — FENTANYL CITRATE (PF) 50 MCG/ML IJ SOSY
25.0000 ug | PREFILLED_SYRINGE | INTRAMUSCULAR | Status: DC | PRN
  Administered 2024-09-08: 50 ug via INTRAVENOUS

## 2024-09-08 MED ORDER — LIDOCAINE HCL (CARDIAC) PF 100 MG/5ML IV SOSY
PREFILLED_SYRINGE | INTRAVENOUS | Status: DC | PRN
  Administered 2024-09-08: 100 mg via INTRAVENOUS

## 2024-09-08 MED ORDER — CHLORHEXIDINE GLUCONATE 0.12 % MT SOLN
15.0000 mL | Freq: Once | OROMUCOSAL | Status: AC
  Administered 2024-09-08: 15 mL via OROMUCOSAL

## 2024-09-08 MED ORDER — CHLORHEXIDINE GLUCONATE CLOTH 2 % EX PADS: 6.0000 | MEDICATED_PAD | Freq: Once | CUTANEOUS | Status: DC

## 2024-09-08 MED ORDER — LACTATED RINGERS IV SOLN: INTRAVENOUS | Status: DC

## 2024-09-08 MED ORDER — PROPOFOL 500 MG/50ML IV EMUL
INTRAVENOUS | Status: DC | PRN
  Administered 2024-09-08: 100 mg via INTRAVENOUS
  Administered 2024-09-08: 115 ug/kg/min via INTRAVENOUS

## 2024-09-08 MED ORDER — TRAMADOL HCL 50 MG PO TABS: 50.0000 mg | ORAL_TABLET | Freq: Four times a day (QID) | ORAL | 0 refills | Status: AC | PRN

## 2024-09-08 MED ORDER — STERILE WATER FOR IRRIGATION IR SOLN
Status: DC | PRN
  Administered 2024-09-08: 1000 mL

## 2024-09-08 MED ORDER — CEFAZOLIN SODIUM-DEXTROSE 2-4 GM/100ML-% IV SOLN
2.0000 g | INTRAVENOUS | Status: DC
  Filled 2024-09-08: qty 100

## 2024-09-08 MED ORDER — MIDAZOLAM HCL (PF) 2 MG/2ML IJ SOLN
INTRAMUSCULAR | Status: DC | PRN
  Administered 2024-09-08: 2 mg via INTRAVENOUS

## 2024-09-08 MED ORDER — ORAL CARE MOUTH RINSE: 15.0000 mL | Freq: Once | OROMUCOSAL | Status: AC

## 2024-09-08 MED ORDER — DEXAMETHASONE SODIUM PHOSPHATE 4 MG/ML IJ SOLN
INTRAMUSCULAR | Status: DC | PRN
  Administered 2024-09-08: 10 mg via INTRAVENOUS

## 2024-09-08 MED ORDER — ACETAMINOPHEN 500 MG PO TABS
1000.0000 mg | ORAL_TABLET | ORAL | Status: AC
  Administered 2024-09-08: 1000 mg via ORAL
  Filled 2024-09-08: qty 2

## 2024-09-08 MED ORDER — PHENYLEPHRINE 80 MCG/ML (10ML) SYRINGE FOR IV PUSH (FOR BLOOD PRESSURE SUPPORT)
PREFILLED_SYRINGE | INTRAVENOUS | Status: DC | PRN
  Administered 2024-09-08 (×2): 80 ug via INTRAVENOUS

## 2024-09-08 MED ORDER — FENTANYL CITRATE (PF) 50 MCG/ML IJ SOSY
PREFILLED_SYRINGE | INTRAMUSCULAR | Status: AC
  Filled 2024-09-08: qty 1

## 2024-09-08 MED ORDER — LIDOCAINE HCL (PF) 2 % IJ SOLN
INTRAMUSCULAR | Status: AC
  Filled 2024-09-08: qty 5

## 2024-09-08 MED ORDER — LIDOCAINE HCL 1 % IJ SOLN
INTRAMUSCULAR | Status: AC
  Filled 2024-09-08: qty 20

## 2024-09-08 NOTE — Anesthesia Procedure Notes (Signed)
 Procedure Name: MAC Date/Time: 09/08/2024 11:40 AM  Performed by: Obadiah Reyes BROCKS, CRNAPre-anesthesia Checklist: Patient identified, Emergency Drugs available, Suction available, Patient being monitored and Timeout performed Patient Re-evaluated:Patient Re-evaluated prior to induction Oxygen Delivery Method: Simple face mask Preoxygenation: Pre-oxygenation with 100% oxygen Airway Equipment and Method: Oral airway

## 2024-09-08 NOTE — H&P (Signed)
 "  CC: Here today for surgery  HPI: Sophia Decker is an 34 y.o. female otherwise healthy, whom is seen in the office today as a referral by Dr. Jerrold for evaluation of possible lipoma of her back.   Reports that for 2 to 3 years she has had a lump just to the left of midline on her mid back. It has been somewhat uncomfortable but over the last week to 2 weeks has become increasingly painful. Over the last 24 hours she has had spontaneous purulent drainage. No fever or chills. She does report that she was prescribed Keflex by her PCP which she is actively taking and has a few more days of this left. She is taking 1 tablet every 6 hours.  She denies ever having had an abscess in this location or anywhere else on her body. She denies any medication history or allergies.  She denies any changes in health or health history since we met in the office. No new medications/allergies. She states she is ready for surgery today.   PMH: Denies  PSH: Breast implants 11/2021; otherwise denies any prior surgical hx  FHx: Mother had breast cancer at age 16; otherwise, denies any known family history of colorectal, breast, endometrial or ovarian cancer. Patient reports she had negative genetic testing back in 2019.  Social Hx: Denies use of tobacco/EtOH/illicit drug. 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 She is here today by herself.   Past Medical History:  Diagnosis Date   Anemia    Family history of breast cancer    Family history of prostate cancer    Medical history non-contributory    Premature rupture of membranes in pregnancy, antepartum 08/12/2013    Past Surgical History:  Procedure Laterality Date   BREAST BIOPSY Right 07/15/2018   x's 2   NO PAST SURGERIES     PLACEMENT OF BREAST IMPLANTS     2023    Family History  Problem Relation Age of Onset   Cancer Father        liver   Migraines Mother    Breast cancer Mother 93    Prostate cancer Maternal Uncle 29   Cancer Paternal Grandmother        NOS    Social:  reports that she has never smoked. She has never used smokeless tobacco. She reports current alcohol use. She reports that she does not use drugs.  Allergies: Allergies[1]  Medications: I have reviewed the patient's current medications.  No results found for this or any previous visit (from the past 48 hours).  No results found.   PE There were no vitals taken for this visit. Constitutional: NAD; conversant Eyes: Moist conjunctiva; no lid lag; anicteric Lungs: Normal respiratory effort CV: RRR MSK: sebaceous cyst site in mid to upper back just off midline, no erythema or active drainage. Site marked. Psychiatric: Appropriate affect  No results found for this or any previous visit (from the past 48 hours).  No results found.  A/P: Sophia Decker is an 34 y.o. female with sebaceous cyst - developed associated abscess last month, now here for planned cyst removal  - We have reviewed options going forward including further observation (with risk of recurrent abscess) vs surgery - excision of sebaceous cyst from back - The planned procedure, material risks (including, but not limited to, pain, bleeding, infection, scarring, seroma/hematoma, recurrence, blood clot, pulmonary embolus, pneumonia, heart attack, stroke,  death) benefits and alternatives to surgery were discussed at length. We also spent time discussing cosmetic outcomes given that she does have a midline back attached to. We discussed that this may not remain completely symmetrical.  - The patient's questions were answered to her satisfaction, she voiced understanding and elected to proceed with surgery. Additionally, we discussed typical postoperative expectations and the recovery process.  We have also reviewed cosmetic considerations as she has a long linear tattoo along her spine. We discussed we would do all we could to   maintain symmetry but that it will likely have a curve in it following this procedure. She understands and agrees to proceed.  Lonni Pizza, MD [facility], A [facility]     [1] No Known Allergies  "

## 2024-09-08 NOTE — Op Note (Signed)
 09/08/2024  12:38 PM  PATIENT:  Sophia Decker Sophia Decker  34 y.o. female  Patient Care Team: Reggie Nian, Thora, NP as PCP - General (Nurse Practitioner)  PRE-OPERATIVE DIAGNOSIS:  Sebaceous cyst of the back  POST-OPERATIVE DIAGNOSIS:  Same  PROCEDURE:  Excision of sebaceous cyst of mid back - 2 x 4 cm (involving skin and subcutaneous tissue)  SURGEON:  Lonni HERO. Genea Rheaume, MD  ANESTHESIA:   local and MAC  COUNTS:  Sponge, needle and instrument counts were reported correct x2 at the conclusion of the operation.  EBL: 1 mL  DRAINS: none  SPECIMEN: Back cyst - short stitch superior, long stitch lateral  COMPLICATIONS: none  FINDINGS: Chronic cystic lesion of the back just off midline tunneling underneath her pre-existing tattoo.  No active infection or purulence encountered.  We were able to prove observe the skin and dermis overlying the tattoo and tunnel our incision underneath.  Lesion measures approximately 2 x 4 cm in size.  Fully excised.  DISPOSITION: PACU in satisfactory condition  DESCRIPTION:  The patient was seen in the pre-op holding area. The risks, benefits, complications, treatment options, and expected outcomes were previously discussed with the patient. The patient agreed with the proposed plan and has signed the informed consent form. The patient was brought to the operating room by the surgical team, identified as Sophia Decker, and the procedure verified. She was placed supine on the operating table and SCD's were applied. General anesthesia was induced without difficulty. She was positioned right lateral decubitus position.  Pressure points were evaluated and padded. MAC anesthesia was induced and she was secured to the operating table. The abdomen was then prepped and draped in the standard sterile fashion. A time out was completed and the above information confirmed and need for preoperative antibiotics.  Lesion is readily identified as it has been  premarked and there is some scarring in this area.  There is no erythema or active purulent drainage.  The lesion is just off midline lateral to her tattoo.  Borders are marked and an elliptical type incision is utilized in an effort to minimize scarring.  Skin is incised sharply with a scalpel as is the subcutaneous tissue to minimize tissue damage/scarring.  We are then able to divide remaining subcutaneous tissue using electrocautery tunneling underneath the lesion.  The cyst wall does lay directly underneath her tattoo.  Therefore, we tunneled this underneath her tattoo in the subcutaneous space.  The lesion is fully excised.  Orientation is maintained and the specimen is oriented with a short stitch superior and a long stitch lateral.  It is passed off.  The wound is then irrigated.  Hemostasis is verified.  There is no evident remaining cyst wall in place.  Attention is then directed at wound closure.  The wound is closed in layers with subcutaneous layer closed with 3-0 Vicryl.  Deep dermal layer was closed with 3-0 Vicryl.  All sponge, needle, and instrument counts were reported correct.  The skin is then closed using a running 3-0 Monocryl subcuticular suture.  We are able to bring the skin edges together without any significant tension.  The wound was then washed and dried.  Dermabond is applied.  She is then awakened from anesthesia and transferred to a stretcher for transport to recovery in satisfactory condition.

## 2024-09-08 NOTE — Discharge Instructions (Addendum)
POST OP INSTRUCTIONS  DIET: As tolerated. Follow a light bland diet the first 24 hours after arrival home, such as soup, liquids, crackers, etc.  Be sure to include lots of fluids daily.  Avoid fast food or heavy meals as your are more likely to get nauseated.  Eat a low fat the next few days after surgery.  Take your usually prescribed home medications unless otherwise directed.  PAIN CONTROL: Pain is best controlled by a usual combination of three different methods TOGETHER: Ice/Heat Over the counter pain medication Prescription pain medication Most patients will experience some swelling and bruising around the surgical site.  Ice packs or heating pads (30-60 minutes up to 6 times a day) will help. Some people prefer to use ice alone, heat alone, alternating between ice & heat.  Experiment to what works for you.  Swelling and bruising can take several weeks to resolve.   It is helpful to take an over-the-counter pain medication regularly for the first few weeks: Ibuprofen (Motrin/Advil) - '200mg'$  tabs - take 3 tabs ('600mg'$ ) every 6 hours as needed for pain Acetaminophen (Tylenol) - you may take '650mg'$  every 6 hours as needed. You can take this with motrin as they act differently on the body. If you are taking a narcotic pain medication that has acetaminophen in it, do not take over the counter tylenol at the same time.  Iii. NOTE: You may take both of these medications together - most patients  find it most helpful when alternating between the two (i.e. Ibuprofen at 6am,  tylenol at 9am, ibuprofen at 12pm ..Marland Kitchen) A  prescription for pain medication should be given to you upon discharge.  Take your pain medication as prescribed if your pain is not adequatly controlled with the over-the-counter pain reliefs mentioned above.  Avoid getting constipated.  Between the surgery and the pain medications, it is common to experience some constipation.  Increasing fluid intake and taking a fiber supplement (such as  Metamucil, Citrucel, FiberCon, MiraLax, etc) 1-2 times a day regularly will usually help prevent this problem from occurring.  A mild laxative (prune juice, Milk of Magnesia, MiraLax, etc) should be taken according to package directions if there are no bowel movements after 48 hours.    Dressing: Your incision is covered in Dermabond which is like sterile superglue for the skin. This will come off on it's own in a couple weeks. It is waterproof and you may bathe normally starting the day after your surgery in a shower. Avoid baths/pools/lakes/oceans until your wounds have fully healed.  ACTIVITIES as tolerated:   Avoid heavy lifting (>10lbs or 1 gallon of milk) for the next 6 weeks. You may resume regular (light) daily activities beginning the next day--such as daily self-care, walking, climbing stairs--gradually increasing activities as tolerated.  If you can walk 30 minutes without difficulty, it is safe to try more intense activity such as jogging, treadmill, bicycling, low-impact aerobics.  DO NOT PUSH THROUGH PAIN.  Let pain be your guide: If it hurts to do something, don't do it. You may drive when you are no longer taking prescription pain medication, you can comfortably wear a seatbelt, and you can safely maneuver your car and apply brakes.   FOLLOW UP in our office Please call [facility] at [phone] to set up an appointment to see your surgeon in the office for a follow-up appointment approximately 2 weeks after your surgery. Make sure that you call for this appointment the day you arrive home to  insure a convenient appointment time.  9. If you have disability or family leave forms that need to be completed, you may have them completed by your primary care physician's office; for return to work instructions, please ask our office staff and they will be happy to assist you in obtaining this documentation   When to call us [phone]: Poor pain control Reactions / problems with new  medications (rash/itching, etc)  Fever over 101.5 F (38.5 C) Inability to urinate Nausea/vomiting Worsening swelling or bruising Continued bleeding from incision. Increased pain, redness, or drainage from the incision  The clinic staff is available to answer your questions during regular business hours (8:30am-5pm).  Please don't hesitate to call and ask to speak to one of our nurses for clinical concerns.   A surgeon from [facility] is always on call at the hospitals   If you have a medical emergency, go to the nearest emergency room or call 911.  [facility] [address], [address], [place], [place]  [address] MAIN: [phone] FAX: [phone] [email or web address]  

## 2024-09-08 NOTE — Anesthesia Postprocedure Evaluation (Signed)
"   Anesthesia Post Note  Patient: Sophia Decker  Procedure(s) Performed: EXCISION SEBACEOUS CYST ON BACK 2x4CM     Patient location during evaluation: PACU Anesthesia Type: MAC Level of consciousness: awake and alert Pain management: pain level controlled Vital Signs Assessment: post-procedure vital signs reviewed and stable Respiratory status: spontaneous breathing, nonlabored ventilation, respiratory function stable and patient connected to nasal cannula oxygen Cardiovascular status: stable and blood pressure returned to baseline Postop Assessment: no apparent nausea or vomiting Anesthetic complications: no   No notable events documented.  Last Vitals:  Vitals:   09/08/24 1330 09/08/24 1341  BP: 123/79 124/75  Pulse: 66 61  Resp: 18   Temp: 36.5 C   SpO2: 100% 99%    Last Pain:  Vitals:   09/08/24 1341  TempSrc:   PainSc: 3                  [name]      "

## 2024-09-08 NOTE — Transfer of Care (Signed)
 Immediate Anesthesia Transfer of Care Note  Patient: Sophia Decker Mater  Procedure(s) Performed: EXCISION SEBACEOUS CYST ON BACK 2x4CM  Patient Location: PACU  Anesthesia Type:MAC  Level of Consciousness: drowsy and patient cooperative  Airway & Oxygen Therapy: Patient Spontanous Breathing and Patient connected to face mask oxygen  Post-op Assessment: Report given to RN and Post -op Vital signs reviewed and stable  Post vital signs: Reviewed and stable  Last Vitals:  Vitals Value Taken Time  BP 115/73 09/08/24 12:37  Temp    Pulse 65 09/08/24 12:39  Resp 18 09/08/24 12:39  SpO2 100 % 09/08/24 12:39  Vitals shown include unfiled device data.  Last Pain:  Vitals:   09/08/24 1120  TempSrc:   PainSc: 0-No pain         Complications: No notable events documented.

## 2024-09-08 NOTE — Anesthesia Preprocedure Evaluation (Addendum)
"                                    Anesthesia Evaluation  Patient identified by MRN, date of birth, ID band Patient awake    Reviewed: Allergy & Precautions, NPO status , Patient's Chart, lab work & pertinent test results  Airway Mallampati: I  TM Distance: >3 FB Neck ROM: Full    Dental  (+) Teeth Intact, Dental Advisory Given   Pulmonary neg pulmonary ROS   breath sounds clear to auscultation       Cardiovascular negative cardio ROS  Rhythm:Regular Rate:Normal     Neuro/Psych negative neurological ROS  negative psych ROS   GI/Hepatic negative GI ROS, Neg liver ROS,,,  Endo/Other  negative endocrine ROS    Renal/GU negative Renal ROS     Musculoskeletal negative musculoskeletal ROS (+)    Abdominal   Peds  Hematology  (+) Blood dyscrasia, anemia   Anesthesia Other Findings   Reproductive/Obstetrics                              Anesthesia Physical Anesthesia Plan  ASA: 2  Anesthesia Plan: MAC   Post-op Pain Management: Minimal or no pain anticipated   Induction: Intravenous  PONV Risk Score and Plan: 0 and Propofol  infusion  Airway Management Planned: Natural Airway and Simple Face Mask  Additional Equipment: None  Intra-op Plan:   Post-operative Plan:   Informed Consent: I have reviewed the patients History and Physical, chart, labs and discussed the procedure including the risks, benefits and alternatives for the proposed anesthesia with the patient or authorized representative who has indicated his/her understanding and acceptance.       Plan Discussed with: CRNA  Anesthesia Plan Comments:          Anesthesia Quick Evaluation  "

## 2024-09-09 ENCOUNTER — Encounter (HOSPITAL_COMMUNITY): Payer: Self-pay | Admitting: Surgery

## 2024-09-09 LAB — SURGICAL PATHOLOGY

## 2024-12-08 ENCOUNTER — Ambulatory Visit: Admitting: Obstetrics and Gynecology

## 2024-12-14 ENCOUNTER — Ambulatory Visit: Admitting: Obstetrics and Gynecology
